# Patient Record
Sex: Male | Born: 1945 | Race: White | Hispanic: No | Marital: Single | Smoking: Former smoker
Health system: Southern US, Community
[De-identification: ages and names within clinical notes are randomized; demographics above are authoritative.]

## PROBLEM LIST (undated history)

## (undated) DIAGNOSIS — I10 Essential (primary) hypertension: Secondary | ICD-10-CM

## (undated) DIAGNOSIS — J3089 Other allergic rhinitis: Secondary | ICD-10-CM

## (undated) DIAGNOSIS — E785 Hyperlipidemia, unspecified: Secondary | ICD-10-CM

## (undated) DIAGNOSIS — M1990 Unspecified osteoarthritis, unspecified site: Secondary | ICD-10-CM

## (undated) HISTORY — DX: Essential (primary) hypertension: I10

## (undated) HISTORY — DX: Hyperlipidemia, unspecified: E78.5

## (undated) HISTORY — PX: TONSILLECTOMY AND ADENOIDECTOMY: SUR1326

## (undated) HISTORY — DX: Other allergic rhinitis: J30.89

## (undated) HISTORY — PX: COLONOSCOPY: SHX174

## (undated) HISTORY — DX: Unspecified osteoarthritis, unspecified site: M19.90

---

## 2001-04-21 ENCOUNTER — Encounter: Payer: Self-pay | Admitting: Internal Medicine

## 2001-04-21 ENCOUNTER — Ambulatory Visit (HOSPITAL_COMMUNITY): Admission: RE | Admit: 2001-04-21 | Discharge: 2001-04-21 | Payer: Self-pay | Admitting: Internal Medicine

## 2002-12-21 ENCOUNTER — Encounter: Payer: Self-pay | Admitting: Internal Medicine

## 2004-04-24 ENCOUNTER — Ambulatory Visit: Payer: Self-pay | Admitting: Internal Medicine

## 2004-05-01 ENCOUNTER — Ambulatory Visit: Payer: Self-pay | Admitting: Internal Medicine

## 2004-11-22 ENCOUNTER — Ambulatory Visit: Payer: Self-pay | Admitting: Internal Medicine

## 2005-01-22 ENCOUNTER — Ambulatory Visit: Payer: Self-pay | Admitting: Internal Medicine

## 2005-07-23 ENCOUNTER — Ambulatory Visit: Payer: Self-pay | Admitting: Internal Medicine

## 2005-11-26 ENCOUNTER — Ambulatory Visit: Payer: Self-pay | Admitting: Internal Medicine

## 2005-12-24 ENCOUNTER — Ambulatory Visit: Payer: Self-pay | Admitting: Internal Medicine

## 2006-04-08 ENCOUNTER — Ambulatory Visit: Payer: Self-pay | Admitting: Internal Medicine

## 2006-04-08 LAB — CONVERTED CEMR LAB: PSA: 1.92 ng/mL (ref 0.10–4.00)

## 2006-09-04 ENCOUNTER — Ambulatory Visit: Payer: Self-pay | Admitting: Internal Medicine

## 2006-10-30 ENCOUNTER — Ambulatory Visit: Payer: Self-pay | Admitting: Internal Medicine

## 2006-10-30 DIAGNOSIS — E785 Hyperlipidemia, unspecified: Secondary | ICD-10-CM | POA: Insufficient documentation

## 2006-10-31 ENCOUNTER — Encounter (INDEPENDENT_AMBULATORY_CARE_PROVIDER_SITE_OTHER): Payer: Self-pay | Admitting: *Deleted

## 2007-01-06 ENCOUNTER — Ambulatory Visit: Payer: Self-pay | Admitting: Internal Medicine

## 2007-09-09 ENCOUNTER — Ambulatory Visit: Payer: Self-pay | Admitting: Internal Medicine

## 2007-11-14 ENCOUNTER — Ambulatory Visit: Payer: Self-pay | Admitting: Internal Medicine

## 2007-11-14 DIAGNOSIS — K5730 Diverticulosis of large intestine without perforation or abscess without bleeding: Secondary | ICD-10-CM | POA: Insufficient documentation

## 2007-11-14 DIAGNOSIS — J309 Allergic rhinitis, unspecified: Secondary | ICD-10-CM | POA: Insufficient documentation

## 2007-11-17 ENCOUNTER — Telehealth (INDEPENDENT_AMBULATORY_CARE_PROVIDER_SITE_OTHER): Payer: Self-pay | Admitting: *Deleted

## 2008-03-15 ENCOUNTER — Ambulatory Visit: Payer: Self-pay | Admitting: Internal Medicine

## 2008-03-23 ENCOUNTER — Encounter (INDEPENDENT_AMBULATORY_CARE_PROVIDER_SITE_OTHER): Payer: Self-pay | Admitting: *Deleted

## 2008-03-23 LAB — CONVERTED CEMR LAB
ALT: 31 units/L (ref 0–53)
AST: 26 units/L (ref 0–37)
Albumin: 3.7 g/dL (ref 3.5–5.2)
Alkaline Phosphatase: 58 units/L (ref 39–117)
Bilirubin, Direct: 0.1 mg/dL (ref 0.0–0.3)
Cholesterol: 156 mg/dL (ref 0–200)
HDL: 44.1 mg/dL (ref >39.0)
LDL Cholesterol: 99 mg/dL (ref 0–99)
Total Bilirubin: 1.1 mg/dL (ref 0.3–1.2)
Total CHOL/HDL Ratio: 3.5
Total Protein: 6.5 g/dL (ref 6.0–8.3)
Triglycerides: 67 mg/dL (ref 0–149)
VLDL: 13 mg/dL (ref 0–40)

## 2008-10-19 ENCOUNTER — Telehealth (INDEPENDENT_AMBULATORY_CARE_PROVIDER_SITE_OTHER): Payer: Self-pay | Admitting: *Deleted

## 2008-11-08 ENCOUNTER — Ambulatory Visit: Payer: Self-pay | Admitting: Internal Medicine

## 2008-11-08 LAB — CONVERTED CEMR LAB
ALT: 33 units/L (ref 0–53)
AST: 27 units/L (ref 0–37)
Albumin: 3.8 g/dL (ref 3.5–5.2)
Alkaline Phosphatase: 57 units/L (ref 39–117)
BUN: 17 mg/dL (ref 6–23)
Basophils Absolute: 0 10*3/uL (ref 0.0–0.1)
Basophils Relative: 0.7 % (ref 0.0–3.0)
Bilirubin Urine: NEGATIVE
Bilirubin, Direct: 0 mg/dL (ref 0.0–0.3)
Blood in Urine, dipstick: NEGATIVE
CO2: 32 meq/L (ref 19–32)
Calcium: 9.3 mg/dL (ref 8.4–10.5)
Chloride: 104 meq/L (ref 96–112)
Cholesterol: 154 mg/dL (ref 0–200)
Creatinine, Ser: 0.9 mg/dL (ref 0.4–1.5)
Eosinophils Absolute: 0.1 10*3/uL (ref 0.0–0.7)
Eosinophils Relative: 1.5 % (ref 0.0–5.0)
GFR calc non Af Amer: 90.3 mL/min (ref >60)
Glucose, Bld: 105 mg/dL — ABNORMAL HIGH (ref 70–99)
Glucose, Urine, Semiquant: NEGATIVE
HCT: 51.1 % (ref 39.0–52.0)
HDL: 42.3 mg/dL (ref >39.00)
Hemoglobin: 17 g/dL (ref 13.0–17.0)
Hgb A1c MFr Bld: 5.9 % (ref 4.6–6.5)
Ketones, urine, test strip: NEGATIVE
LDL Cholesterol: 97 mg/dL (ref 0–99)
Lymphocytes Relative: 23.1 % (ref 12.0–46.0)
Lymphs Abs: 1.2 10*3/uL (ref 0.7–4.0)
MCHC: 33.2 g/dL (ref 30.0–36.0)
MCV: 97.8 fL (ref 78.0–100.0)
Monocytes Absolute: 0.4 10*3/uL (ref 0.1–1.0)
Monocytes Relative: 7.8 % (ref 3.0–12.0)
Neutro Abs: 3.6 10*3/uL (ref 1.4–7.7)
Neutrophils Relative %: 66.9 % (ref 43.0–77.0)
Nitrite: NEGATIVE
PSA: 1.76 ng/mL (ref 0.10–4.00)
Platelets: 175 10*3/uL (ref 150.0–400.0)
Potassium: 4.6 meq/L (ref 3.5–5.1)
Protein, U semiquant: NEGATIVE
RBC: 5.23 M/uL (ref 4.22–5.81)
RDW: 12.2 % (ref 11.5–14.6)
Sodium: 141 meq/L (ref 135–145)
Specific Gravity, Urine: 1.01
TSH: 2.32 microintl units/mL (ref 0.35–5.50)
Total Bilirubin: 1.1 mg/dL (ref 0.3–1.2)
Total CHOL/HDL Ratio: 4
Total Protein: 7.1 g/dL (ref 6.0–8.3)
Triglycerides: 73 mg/dL (ref 0.0–149.0)
Urobilinogen, UA: 0.2
VLDL: 14.6 mg/dL (ref 0.0–40.0)
WBC Urine, dipstick: NEGATIVE
WBC: 5.3 10*3/uL (ref 4.5–10.5)
pH: 6

## 2008-11-17 ENCOUNTER — Ambulatory Visit: Payer: Self-pay | Admitting: Internal Medicine

## 2008-11-17 DIAGNOSIS — G4730 Sleep apnea, unspecified: Secondary | ICD-10-CM | POA: Insufficient documentation

## 2008-11-18 ENCOUNTER — Encounter: Payer: Self-pay | Admitting: Internal Medicine

## 2008-11-26 ENCOUNTER — Telehealth (INDEPENDENT_AMBULATORY_CARE_PROVIDER_SITE_OTHER): Payer: Self-pay | Admitting: *Deleted

## 2008-12-16 ENCOUNTER — Ambulatory Visit: Payer: Self-pay | Admitting: Internal Medicine

## 2008-12-17 ENCOUNTER — Encounter: Payer: Self-pay | Admitting: Internal Medicine

## 2008-12-17 ENCOUNTER — Ambulatory Visit (HOSPITAL_BASED_OUTPATIENT_CLINIC_OR_DEPARTMENT_OTHER): Admission: RE | Admit: 2008-12-17 | Discharge: 2008-12-17 | Payer: Self-pay | Admitting: Internal Medicine

## 2008-12-18 ENCOUNTER — Ambulatory Visit: Payer: Self-pay | Admitting: Internal Medicine

## 2008-12-28 ENCOUNTER — Telehealth: Payer: Self-pay | Admitting: Internal Medicine

## 2009-03-10 ENCOUNTER — Ambulatory Visit: Payer: Self-pay | Admitting: Internal Medicine

## 2009-03-10 LAB — CONVERTED CEMR LAB
OCCULT 1: NEGATIVE
OCCULT 2: NEGATIVE
OCCULT 3: NEGATIVE

## 2009-03-11 ENCOUNTER — Encounter (INDEPENDENT_AMBULATORY_CARE_PROVIDER_SITE_OTHER): Payer: Self-pay | Admitting: *Deleted

## 2009-04-06 ENCOUNTER — Telehealth (INDEPENDENT_AMBULATORY_CARE_PROVIDER_SITE_OTHER): Payer: Self-pay | Admitting: *Deleted

## 2009-04-29 ENCOUNTER — Telehealth: Payer: Self-pay | Admitting: Internal Medicine

## 2009-11-09 ENCOUNTER — Ambulatory Visit: Payer: Self-pay | Admitting: Internal Medicine

## 2009-11-09 LAB — CONVERTED CEMR LAB
Albumin: 3.9 g/dL (ref 3.5–5.2)
Alkaline Phosphatase: 57 units/L (ref 39–117)
BUN: 19 mg/dL (ref 6–23)
Basophils Relative: 0.8 % (ref 0.0–3.0)
Bilirubin, Direct: 0.1 mg/dL (ref 0.0–0.3)
CO2: 29 meq/L (ref 19–32)
Chloride: 105 meq/L (ref 96–112)
Cholesterol: 141 mg/dL (ref 0–200)
Creatinine, Ser: 1 mg/dL (ref 0.4–1.5)
Glucose, Bld: 95 mg/dL (ref 70–99)
HCT: 45.9 % (ref 39.0–52.0)
Hemoglobin: 15.6 g/dL (ref 13.0–17.0)
LDL Cholesterol: 88 mg/dL (ref 0–99)
Lymphocytes Relative: 20.3 % (ref 12.0–46.0)
Lymphs Abs: 1 10*3/uL (ref 0.7–4.0)
Monocytes Relative: 9 % (ref 3.0–12.0)
Neutro Abs: 3.2 10*3/uL (ref 1.4–7.7)
Potassium: 4.8 meq/L (ref 3.5–5.1)
RBC: 4.78 M/uL (ref 4.22–5.81)
RDW: 13 % (ref 11.5–14.6)
Total CHOL/HDL Ratio: 4
Total Protein: 6.2 g/dL (ref 6.0–8.3)
Triglycerides: 66 mg/dL (ref 0.0–149.0)
VLDL: 13.2 mg/dL (ref 0.0–40.0)

## 2009-11-14 ENCOUNTER — Ambulatory Visit: Payer: Self-pay | Admitting: Internal Medicine

## 2009-11-14 DIAGNOSIS — I10 Essential (primary) hypertension: Secondary | ICD-10-CM | POA: Insufficient documentation

## 2010-03-19 LAB — CONVERTED CEMR LAB
ALT: 29 units/L (ref 0–53)
ALT: 32 units/L (ref 0–53)
AST: 25 units/L (ref 0–37)
AST: 30 units/L (ref 0–37)
Albumin: 3.7 g/dL (ref 3.5–5.2)
Albumin: 4.1 g/dL (ref 3.5–5.2)
Alkaline Phosphatase: 69 units/L (ref 39–117)
Alkaline Phosphatase: 78 units/L (ref 39–117)
BUN: 15 mg/dL (ref 6–23)
BUN: 17 mg/dL (ref 6–23)
Basophils Absolute: 0 10*3/uL (ref 0.0–0.1)
Basophils Absolute: 0 10*3/uL (ref 0.0–0.1)
Basophils Relative: 0.2 % (ref 0.0–1.0)
Basophils Relative: 0.8 % (ref 0.0–3.0)
Bilirubin, Direct: 0.1 mg/dL (ref 0.0–0.3)
Bilirubin, Direct: 0.2 mg/dL (ref 0.0–0.3)
CO2: 31 meq/L (ref 19–32)
CO2: 31 meq/L (ref 19–32)
Calcium: 9.2 mg/dL (ref 8.4–10.5)
Calcium: 9.4 mg/dL (ref 8.4–10.5)
Chloride: 104 meq/L (ref 96–112)
Chloride: 105 meq/L (ref 96–112)
Cholesterol, target level: 200 mg/dL
Cholesterol: 169 mg/dL (ref 0–200)
Cholesterol: 173 mg/dL (ref 0–200)
Creatinine, Ser: 0.9 mg/dL (ref 0.4–1.5)
Creatinine, Ser: 0.9 mg/dL (ref 0.4–1.5)
Eosinophils Absolute: 0.1 10*3/uL (ref 0.0–0.6)
Eosinophils Absolute: 0.1 10*3/uL (ref 0.0–0.7)
Eosinophils Relative: 1.5 % (ref 0.0–5.0)
Eosinophils Relative: 2 % (ref 0.0–5.0)
GFR calc Af Amer: 110 mL/min
GFR calc Af Amer: 110 mL/min
GFR calc non Af Amer: 91 mL/min
GFR calc non Af Amer: 91 mL/min
Glucose, Bld: 102 mg/dL — ABNORMAL HIGH (ref 70–99)
Glucose, Bld: 95 mg/dL (ref 70–99)
HCT: 46.6 % (ref 39.0–52.0)
HCT: 47.2 % (ref 39.0–52.0)
HDL goal, serum: 40 mg/dL
HDL: 41.6 mg/dL (ref >39.0)
HDL: 42.1 mg/dL (ref >39.0)
Hemoglobin: 15.9 g/dL (ref 13.0–17.0)
Hemoglobin: 16.3 g/dL (ref 13.0–17.0)
LDL Cholesterol: 109 mg/dL — ABNORMAL HIGH (ref 0–99)
LDL Cholesterol: 114 mg/dL — ABNORMAL HIGH (ref 0–99)
LDL Goal: 90 mg/dL
Lymphocytes Relative: 21 % (ref 12.0–46.0)
Lymphocytes Relative: 21.6 % (ref 12.0–46.0)
MCHC: 33.6 g/dL (ref 30.0–36.0)
MCHC: 35 g/dL (ref 30.0–36.0)
MCV: 94.8 fL (ref 78.0–100.0)
MCV: 96.6 fL (ref 78.0–100.0)
Monocytes Absolute: 0.4 10*3/uL (ref 0.2–0.7)
Monocytes Absolute: 0.5 10*3/uL (ref 0.1–1.0)
Monocytes Relative: 8.4 % (ref 3.0–12.0)
Monocytes Relative: 9.5 % (ref 3.0–11.0)
Neutro Abs: 3.2 10*3/uL (ref 1.4–7.7)
Neutro Abs: 3.7 10*3/uL (ref 1.4–7.7)
Neutrophils Relative %: 67.2 % (ref 43.0–77.0)
Neutrophils Relative %: 67.8 % (ref 43.0–77.0)
PSA: 1.56 ng/mL (ref 0.10–4.00)
PSA: 1.85 ng/mL (ref 0.10–4.00)
Platelets: 220 10*3/uL (ref 150–400)
Platelets: 230 10*3/uL (ref 150–400)
Potassium: 4.5 meq/L (ref 3.5–5.1)
Potassium: 4.6 meq/L (ref 3.5–5.1)
RBC: 4.88 M/uL (ref 4.22–5.81)
RBC: 4.92 M/uL (ref 4.22–5.81)
RDW: 11.4 % — ABNORMAL LOW (ref 11.5–14.6)
RDW: 12.1 % (ref 11.5–14.6)
Sodium: 140 meq/L (ref 135–145)
Sodium: 141 meq/L (ref 135–145)
TSH: 3.08 microintl units/mL (ref 0.35–5.50)
TSH: 3.24 microintl units/mL (ref 0.35–5.50)
Total Bilirubin: 0.9 mg/dL (ref 0.3–1.2)
Total Bilirubin: 1 mg/dL (ref 0.3–1.2)
Total CHOL/HDL Ratio: 4
Total CHOL/HDL Ratio: 4.2
Total Protein: 6.6 g/dL (ref 6.0–8.3)
Total Protein: 7 g/dL (ref 6.0–8.3)
Triglycerides: 88 mg/dL (ref 0–149)
Triglycerides: 92 mg/dL (ref 0–149)
VLDL: 18 mg/dL (ref 0–40)
VLDL: 18 mg/dL (ref 0–40)
WBC: 4.7 10*3/uL (ref 4.5–10.5)
WBC: 5.5 10*3/uL (ref 4.5–10.5)

## 2010-03-21 NOTE — Progress Notes (Signed)
Summary: Medication Concerns  Phone Note Outgoing Call Call back at [facility] Phone [phone] Call back at [phone]   Call placed by: Shonna Chock,  April 06, 2009 8:56 AM Call placed to: Patient Summary of Call: Left message on machine for patient to return call when avaliable, Reason for call:  We recieved a refill request for Amlodipine-Benazepril 5-20 (Other name: Lotrel) and we do not have that on med list, we have patient taking amlodipine 5mg  once daily . Need to clarify./Chrae [facility]  April 06, 2009 8:58 AM   Follow-up for Phone Call        Called patient on work number and he said Dr.Hopper gave him a RX for the combo med. Patient said the pharmacy filled it for him yesterday for 5-10 but he is not sure if it should be5-10 or 5-20. Patient said if I could check and let him know.  Dr.Hopper I am unfamiliar with this entire RX being prescibed for I have reviewed the chart and see no documentation. Please clarify and forward back to me to futher discuss with patient. Follow-up by: Shonna Chock,  April 06, 2009 11:55 AM  Additional Follow-up for Phone Call Additional follow up Details #1::        Take 5/10 & monitor BP ; goal = AVERAGE < 135/85 Additional Follow-up by: Marga Melnick MD,  April 06, 2009 1:44 PM    Additional Follow-up for Phone Call Additional follow up Details #2::    Patient aware, patient said he does not need a rx @ this time./Chrae [facility]  April 06, 2009 2:31 PM   New/Updated Medications: AMLODIPINE BESY-BENAZEPRIL HCL 5-10 MG CAPS (AMLODIPINE BESY-BENAZEPRIL HCL) 1 by mouth once daily

## 2010-03-21 NOTE — Progress Notes (Signed)
Summary: elevated BP   Phone Note Call from Patient Call back at [phone]   Caller: Patient Summary of Call: Pt states that BP has been running 136 to146 and the bottom number averaging 80-82. pt denies any symptoms other than BP elevated. pt currently taking AMLODIPINE BESY-BENAZEPRIL HCL 5-10 MG. pt [facility]. pls advise..................Marland KitchenFelecia Deloach CMA  April 29, 2009 10:27 AM   Follow-up for Phone Call        take 2 pills (total dose 10/20)once daily & monitor ; goal = average < 135/85  Additional Follow-up for Phone Call Additional follow up Details #1::        pt aware..............Marland KitchenFelecia Deloach CMA  April 29, 2009 11:57 AM     New/Updated Medications: AMLODIPINE BESY-BENAZEPRIL HCL 5-10 MG CAPS (AMLODIPINE BESY-BENAZEPRIL HCL) Take 2 tab by mouth once daily

## 2010-03-21 NOTE — Letter (Signed)
Summary: Results Follow up Letter  [facility] [place]  [address] [place], [place] [address]   Phone: [phone]  Fax: [phone]    03/11/2009 MRN: [id number]  TARIQ PERNELL [address] [place], [place]  [address]  Dear Mr. FERIA,  The following are the results of your recent test(s):  Test         Result    Pap Smear:        Normal _____  Not Normal _____ Comments: ______________________________________________________ Cholesterol: LDL(Bad cholesterol):         Your goal is less than:         HDL (Good cholesterol):       Your goal is more than: Comments:  ______________________________________________________ Mammogram:        Normal _____  Not Normal _____ Comments:  ___________________________________________________________________ Hemoccult:        Normal __X___  Not normal _______ Comments:    _____________________________________________________________________ Other Tests:    We routinely do not discuss normal results over the telephone.  If you desire a copy of the results, or you have any questions about this information we can discuss them at your next office visit.   Sincerely,

## 2010-03-21 NOTE — Assessment & Plan Note (Signed)
Summary: CPX,MEDICARE & BCBS/RH....   Vital Signs:  Patient profile:   65 year old male Height:      68.5 inches Weight:      188.38 pounds BMI:     28.33 Pulse rate:   77 / minute Pulse rhythm:   regular Resp:     14 per minute BP sitting:   132 / 84  (left arm) Cuff size:   large  Vitals Entered By: Army Fossa CMA (November 14, 2009 1:06 PM) CC: Medicare CPX, fasting. No complaints. flu shot, Lipid Management   Primary Care Alena Blankenbeckler:  Dr. Marga Melnick  CC:  Medicare CPX, fasting. No complaints. flu shot, and Lipid Management.  History of Present Illness: .Here for Medicare AWV: 1.Risk factors based on Past M, S, F history:Dyslipidemia; HTN( Chart updated) 2.Physical Activities:hunting; walking 1.5 mpd 5X/week  3.Depression/mood: no issues 4.Hearing: intact @ 6 ft to whisper 5.ADL's: no limitations 6.Fall Risk: no balance or co-ordination issues 7.Home Safety: no issues 8.Height, weight, &visual acuity:wall chart read w/o lenses @ 6 ft 9.Counseling: none requested  10.Labs ordered based on risk factors: results reviewed & risks discussed 11.  Referral Coordination: none requested 12. Care Plan: see Instructions 13. Cognitive Assessment: Oriented X 3; memory & recall  intact  ; "WORLD" spelled backwars; mood & affect normal. Hyperlipidemia Follow-Up      This is a 65 year old man who presents for Hyperlipidemia follow-up.  The patient reports muscle aches  only after golf, but denies GI upset, abdominal pain, flushing, itching, constipation, diarrhea, and fatigue.  The patient denies the following symptoms: chest pain/pressure, exercise intolerance, dypsnea, palpitations, syncope, and pedal edema.  Compliance with medications (by patient report) has been near 100%.  Dietary compliance has been good.  Adjunctive measures currently used by the patient include ASA and fish oil supplements.   Hypertension Follow-Up      The patient also presents for Hypertension  follow-up.  The patient reports urinary frequency, but denies lightheadedness and headaches.  Compliance with medications (by patient report) has been near 100%.  Adjunctive measures currently used by the patient include salt restriction.  BP 120/80 @ home. Cough essentially gone off ACE-I.  Lipid Management History:      Positive NCEP/ATP III risk factors include male age 15 years old or older and hypertension.  Negative NCEP/ATP III risk factors include non-diabetic, no family history for ischemic heart disease, non-tobacco-user status, no ASHD (atherosclerotic heart disease), no prior stroke/TIA, no peripheral vascular disease, and no history of aortic aneurysm.     Preventive Screening-Counseling & Management  Alcohol-Tobacco     Alcohol drinks/day: 2     Smoking Status: quit     Packs/Day: up to 1/2 ppd     Year Quit: 1970  Caffeine-Diet-Exercise     Caffeine use/day: 1 cup every other day     Diet Comments: no specific diet  Hep-HIV-STD-Contraception     Dental Visit-last 6 months yes     Sun Exposure-Excessive: no  Safety-Violence-Falls     Seat Belt Use: yes     Firearms in the Home: firearms in the home     Firearm Counseling: not indicated; uses recommended firearm safety measures     Smoke Detectors: no      Sexual History:  Single and dating.        Blood Transfusions:  no.        Travel History:  no recent foreign travel.    Current Medications (verified): 1)  Bayer Low Strength 81 Mg Tbec (Aspirin) .Marland Kitchen.. 1 By Mouth Once Daily 2)  Crestor 20 Mg Tabs (Rosuvastatin Calcium) .Marland Kitchen.. 1 Qd 3)  Viagra 100 Mg Tabs (Sildenafil Citrate) .... 1/2 -1 Once Daily As Needed 4)  Amlodipine Besy-Benazepril Hcl 5-10 Mg Caps (Amlodipine Besy-Benazepril Hcl) .... Take 2 Tab By Mouth Once Daily 5)  Prostate Pro .... Daily  Allergies (verified): 1)  ! Benazepril Hcl (Benazepril Hcl)  Past History:  Past Medical History: Hyperlipidemia:LDL 138 ( 2067 total particles / 1188 small  dense ), TG 59 in 2004; LDL goal = < 100,ideally < 70. Framingham Study LDL goal = < 130. Allergic rhinitis Diverticulosis, colon 2003 Hypertension  Past Surgical History: Colonoscopy:Tics  2003, Dr  Barnet Pall Tonsillectomy  Family History: Father: died  disseminated  intraabdominal cancer , Asbestos exposure Mother:  goiter, coma ? 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 Alcohol use-yes:socially Regular exercise-yes: golf, hunt,walks 5X/week , 1.5 mpd, hunts Former Smoker: quit [employer] x 4 years Divorced/ single  Packs/Day:  up to 1/2 ppd Caffeine use/day:  1 cup every other day Dental Care w/in 6 mos.:  yes Sun Exposure-Excessive:  no Seat Belt Use:  yes Sexual History:  Single, dating Blood Transfusions:  no  Review of Systems  The patient denies anorexia, weight loss, weight gain, vision loss, decreased hearing, hoarseness, prolonged cough, hemoptysis, melena, hematochezia, severe indigestion/heartburn, hematuria, suspicious skin lesions, depression, unusual weight change, abnormal bleeding, enlarged lymph nodes, and angioedema.    Physical Exam  General:  well-nourished; alert,appropriate and cooperative throughout examination Head:  Normocephalic and atraumatic without obvious abnormalities.  Moustache & goatee Eyes:  No corneal or conjunctival inflammation noted. EOMI. Perrla. Funduscopic exam benign, without hemorrhages, exudates or papilledema.  Ears:  External ear exam shows no significant lesions or deformities.  Otoscopic examination reveals clear canals, tympanic membranes are intact bilaterally without bulging, retraction, inflammation or discharge. Hearing is grossly normal bilaterally. Nose:  External nasal examination shows no deformity or inflammation. Nasal mucosa are pink and moist without lesions or exudates. Mouth:  Oral mucosa and oropharynx without lesions or exudates.  Teeth in good  repair. Neck:  No deformities, masses, or tenderness noted. Lungs:  Normal respiratory effort, chest expands symmetrically. Lungs are clear to auscultation, no crackles or wheezes. Heart:  Normal rate and regular rhythm. S1 and S2 normal without gallop, murmur, click, rub.S4 Abdomen:  Bowel sounds positive,abdomen soft and non-tender without masses, organomegaly or hernias noted. Rectal:  No external abnormalities noted. Normal sphincter tone. No rectal masses or tenderness. Genitalia:  Testes bilaterally descended without nodularity, tenderness or masses. No scrotal masses or lesions. No penis lesions or urethral discharge. L varicocele.   Prostate:  no gland enlargement, no nodules, but  R lobe slightly lrger than L Msk:  No deformity or scoliosis noted of thoracic or lumbar spine.   Pulses:  R and L carotid,radial,dorsalis pedis and posterior tibial pulses are full and equal bilaterally Extremities:  No clubbing, cyanosis, edema, or deformity noted with normal full range of motion of all joints.   Neurologic:  alert & oriented X3 and DTRs symmetrical and normal.   Skin:  Intact without suspicious lesions or rashes. Cherry angioma R inferior thorax Cervical Nodes:  No lymphadenopathy noted Axillary Nodes:  No palpable lymphadenopathy Inguinal Nodes:  No significant adenopathy Psych:  memory intact for recent and remote, normally interactive, and good eye contact.  Impression & Recommendations:  Problem # 1:  PREVENTIVE HEALTH CARE (ICD-V70.0)  Orders: Medicare -1st Annual Wellness Visit [id number])  Problem # 2:  HYPERTENSION (ICD-401.9)  The following medications were removed from the medication list:    Amlodipine Besy-benazepril Hcl 5-10 Mg Caps (Amlodipine besy-benazepril hcl) .Marland Kitchen... Take 2 tab by mouth once daily His updated medication list for this problem includes:    Losartan Potassium 100 Mg Tabs (Losartan potassium) .Marland Kitchen... 1 once daily    Amlodipine Besylate 5 Mg Tabs  (Amlodipine besylate) .Marland Kitchen... 1 once daily  Orders: EKG w/ Interpretation (93000)  Problem # 3:  HYPERLIPIDEMIA (ICD-272.4)  His updated medication list for this problem includes:    Crestor 20 Mg Tabs (Rosuvastatin calcium) .Marland Kitchen... 1 qd  Problem # 4:  SLEEP APNEA (ICD-780.57) Snoring 95% improved with Breathe Right strips by history, no CPAP employed  Complete Medication List: 1)  Bayer Low Strength 81 Mg Tbec (Aspirin) .Marland Kitchen.. 1 by mouth once daily 2)  Crestor 20 Mg Tabs (Rosuvastatin calcium) .Marland Kitchen.. 1 qd 3)  Viagra 100 Mg Tabs (Sildenafil citrate) .... 1/2 -1 once daily as needed 4)  Prostate Pro  .... Daily 5)  Losartan Potassium 100 Mg Tabs (Losartan potassium) .Marland Kitchen.. 1 once daily 6)  Amlodipine Besylate 5 Mg Tabs (Amlodipine besylate) .Marland Kitchen.. 1 once daily  Other Orders: Flu Vaccine 6yrs + MEDICARE PATIENTS (N0272) Administration Flu vaccine - MCR (G0008)  Lipid Assessment/Plan:      Based on NCEP/ATP III, the patient's risk factor category is "0-1 risk factors".  The patient's lipid goals are as follows: Total cholesterol goal is 200; LDL cholesterol goal is 100; HDL cholesterol goal is 40; Triglyceride goal is 150.  His LDL cholesterol goal has been met.  Secondary causes for hyperlipidemia have been ruled out.  He has been counseled on adjunctive measures for lowering his cholesterol and has been provided with dietary instructions.   Flu Vaccine Consent Questions     Do you have a history of severe allergic reactions to this vaccine? no    Any prior history of allergic reactions to egg and/or gelatin? no    Do you have a sensitivity to the preservative Thimersol? no    Do you have a past history of Guillan-Barre Syndrome? no    Do you currently have an acute febrile illness? no    Have you ever had a severe reaction to latex? no    Vaccine information given and explained to patient? yes    Are you currently pregnant? no    Lot Number:[id number]   Exp Date:08/19/2010   Site Given  RIGHT  Deltoid IM  Patient Instructions: 1)  Take an  81 mg coated Aspirin every day. 2)  Check your Blood Pressure regularly. If it is above: 135/85 ON AVERAGE  you should make an appointment. Reassess need for CPAP if snoring progresses or apnea noted. Prescriptions: AMLODIPINE BESYLATE 5 MG TABS (AMLODIPINE BESYLATE) 1 once daily  #90 x 3   Entered and Authorized by:   Marga Melnick MD   Signed by:   Marga Melnick MD on 11/14/2009   Method used:   Print then Give to Patient   RxID:   [id number] LOSARTAN POTASSIUM 100 MG TABS (LOSARTAN POTASSIUM) 1 once daily  #90 x 3   Entered and Authorized by:   Marga Melnick MD   Signed by:   Marga Melnick MD on 11/14/2009   Method used:   Print then Give to Patient   RxID:  [id number] CRESTOR 20 MG TABS (ROSUVASTATIN CALCIUM) 1 qd  #90 Tablet x 3   Entered and Authorized by:   Marga Melnick MD   Signed by:   Marga Melnick MD on 11/14/2009   Method used:   Print then Give to Patient   RxID:   [id number]    .lbmedflu

## 2010-04-10 ENCOUNTER — Encounter: Payer: Self-pay | Admitting: Internal Medicine

## 2010-04-10 ENCOUNTER — Telehealth: Payer: Self-pay | Admitting: Internal Medicine

## 2010-04-10 ENCOUNTER — Ambulatory Visit (INDEPENDENT_AMBULATORY_CARE_PROVIDER_SITE_OTHER): Payer: Medicare Other | Admitting: Internal Medicine

## 2010-04-10 DIAGNOSIS — H6500 Acute serous otitis media, unspecified ear: Secondary | ICD-10-CM | POA: Insufficient documentation

## 2010-04-18 NOTE — Assessment & Plan Note (Signed)
Summary: ear pain--PH   Vital Signs:  Patient profile:   65 year old male Weight:      192.4 pounds BMI:     28.93 Temp:     97.5 degrees F oral Pulse rate:   60 / minute Resp:     14 per minute BP sitting:   112 / 78  (left arm) Cuff size:   large  Vitals Entered By: Shonna Chock CMA (April 10, 2010 9:19 AM) CC: Noise in ears when laying down x 1 week , Ear pain   Primary Care Provider:  Dr. Marga Melnick  CC:  Noise in ears when laying down x 1 week  and Ear pain.  History of Present Illness:    Onset 1 week ago as "ears filling up" ;he also reports ear discharge, sensation of fullness, and hearing loss, but denies tinnitus, fever, sinus pain, and nasal discharge.   Symptoms are  located in both ears & described as constant.  The patient denies headache, toothache, dizziness, and vertigo.  Patient's history includes  some hay fever/allergies and impacted cerumen. His floors are being redone & house painted .  Rx; peroxide drops.  Current Medications (verified): 1)  Bayer Low Strength 81 Mg Tbec (Aspirin) .Marland Kitchen.. 1 By Mouth Once Daily 2)  Crestor 20 Mg Tabs (Rosuvastatin Calcium) .Marland Kitchen.. 1 Qd 3)  Viagra 100 Mg Tabs (Sildenafil Citrate) .... 1/2 -1 Once Daily As Needed 4)  Prostate Pro .... Daily 5)  Losartan Potassium 100 Mg Tabs (Losartan Potassium) .Marland Kitchen.. 1 Once Daily 6)  Amlodipine Besylate 5 Mg Tabs (Amlodipine Besylate) .Marland Kitchen.. 1 Once Daily  Allergies: 1)  ! Benazepril Hcl (Benazepril Hcl)  Physical Exam  General:  well-nourished,in no acute distress; alert,appropriate and cooperative throughout examination Ears:  External ear exam shows no significant lesions or deformities.  Otoscopic examination reveals clear canals, tympanic membranes are intact bilaterally without bulging, retraction, inflammation or discharge but they are dull. Hearing is grossly normal bilaterally.Rinne  (BC>AC) and Weber normal.   Nose:  External nasal examination shows no deformity or inflammation.  Nasal mucosa are pink and moist without lesions or exudates. Mouth:  Oral mucosa and oropharynx without lesions or exudates.  Teeth in good repair. Mild pharyngeal erythema (uvula).   Cervical Nodes:  No lymphadenopathy noted Axillary Nodes:  No palpable lymphadenopathy   Impression & Recommendations:  Problem # 1:  OTITIS MEDIA, SEROUS, ACUTE, BILATERAL (ICD-381.01)  Complete Medication List: 1)  Bayer Low Strength 81 Mg Tbec (Aspirin) .Marland Kitchen.. 1 by mouth once daily 2)  Crestor 20 Mg Tabs (Rosuvastatin calcium) .Marland Kitchen.. 1 qd 3)  Viagra 100 Mg Tabs (Sildenafil citrate) .... 1/2 -1 once daily as needed 4)  Prostate Pro  .... Daily 5)  Losartan Potassium 100 Mg Tabs (Losartan potassium) .Marland Kitchen.. 1 once daily 6)  Amlodipine Besylate 5 Mg Tabs (Amlodipine besylate) .Marland Kitchen.. 1 once daily 7)  Fluticasone Propionate 50 Mcg/act Susp (Fluticasone propionate) .Marland Kitchen.. 1 spray two times a day as needed 8)  Prednisone 20 Mg Tabs (Prednisone) .Marland Kitchen.. 1 two times a day with a meal 9)  Coly-mycin S 3.04-21-08-0.5 Mg/ml Susp (Neomycin-colist-hc-thonzonium) .... 3 drops 4x/ day  Patient Instructions: 1)  Drink as much NON dairy  fluid as you can tolerate for the next few days. Prescriptions: COLY-MYCIN S 3.04-21-08-0.5 MG/ML SUSP (NEOMYCIN-COLIST-HC-THONZONIUM) 3 drops 4X/ day  #10cc x 0   Entered and Authorized by:   Marga Melnick MD   Signed by:   Marga Melnick MD on 04/10/2010  Method used:   Print then Give to Patient   RxID:   [id number] PREDNISONE 20 MG TABS (PREDNISONE) 1 two times a day with a meal  #10 x 0   Entered and Authorized by:   Marga Melnick MD   Signed by:   Marga Melnick MD on 04/10/2010   Method used:   Electronically to        [id number] (retail)       [address]       [place], [place]  [address]       Ph: [phone] or [phone]       Fax: [phone]   RxID:   [id number] FLUTICASONE PROPIONATE 50 MCG/ACT SUSP (FLUTICASONE PROPIONATE)  1 spray two times a day as needed  #1 x 5   Entered and Authorized by:   Marga Melnick MD   Signed by:   Marga Melnick MD on 04/10/2010   Method used:   Electronically to        [id number] (retail)       [address]       [place], [place]  [address]       Ph: [phone] or [phone]       Fax: [phone]   RxID:   [id number]    Orders Added: 1)  Est. Patient Level III [[address]]

## 2010-04-18 NOTE — Progress Notes (Signed)
Summary: RX change/substitution  Phone Note Refill Request Message from:  Pharmacy on April 10, 2010 9:57 AM  Refills Requested: Medication #1:  COLY-MYCIN S 3.04-21-08-0.5 MG/ML SUSP 3 drops 4X/ day.   Notes: [facility] called to ask if prescription above could be change to Generic Cortisporin. Patient will save the patient $34. **I gave permission to make this change for the patient.  Initial call taken by: Lucious Groves CMA,  April 10, 2010 9:57 AM  Follow-up for Phone Call        thanks; that is correct Follow-up by: Marga Melnick MD,  April 10, 2010 1:12 PM

## 2010-06-23 ENCOUNTER — Other Ambulatory Visit: Payer: Self-pay | Admitting: *Deleted

## 2010-06-23 MED ORDER — CELECOXIB 200 MG PO CAPS: ORAL_CAPSULE | ORAL | Status: DC

## 2010-06-23 NOTE — Telephone Encounter (Signed)
Pt called says he was given samples of celebrex and would like to have rx sent to pharmacy. [facility]

## 2010-07-12 ENCOUNTER — Telehealth: Payer: Self-pay | Admitting: *Deleted

## 2010-07-12 NOTE — Telephone Encounter (Signed)
Pt called and left message on triage line, wants Generic med- unsure of name. Called pt had to leave a message.

## 2010-07-13 MED ORDER — CYCLOBENZAPRINE HCL 5 MG PO TABS: ORAL_TABLET | ORAL | Status: DC

## 2010-07-13 NOTE — Telephone Encounter (Signed)
I spoke w/ pt he states he has been prescribed Metaxalone 800 mg, not by Dr.Hopper. The prescribing doc has retired. He states he cannot afford this medication. Would like an alternative muscle relaxant. Please advise.

## 2010-07-13 NOTE — Telephone Encounter (Signed)
Generic Flexeril 5 mg ( cyclobenzaprine) 1-2 pills at bedtime as needed dispense# 20

## 2010-07-13 NOTE — Telephone Encounter (Signed)
Pt is aware.  

## 2010-08-09 ENCOUNTER — Ambulatory Visit (INDEPENDENT_AMBULATORY_CARE_PROVIDER_SITE_OTHER): Payer: Medicare Other | Admitting: Family Medicine

## 2010-08-09 VITALS — BP 130/88 | HR 62 | Temp 98.0°F | Wt 187.8 lb

## 2010-08-09 DIAGNOSIS — S59909A Unspecified injury of unspecified elbow, initial encounter: Secondary | ICD-10-CM

## 2010-08-09 DIAGNOSIS — S59919A Unspecified injury of unspecified forearm, initial encounter: Secondary | ICD-10-CM

## 2010-08-09 DIAGNOSIS — R42 Dizziness and giddiness: Secondary | ICD-10-CM

## 2010-08-09 NOTE — Progress Notes (Signed)
  Subjective:    Patient ID: Daniel Heath, male    DOB: 1946-01-25, 65 y.o.   MRN: [id number]  HPI Lightheaded- had some dizziness yesterday afternoon and again this AM.  Each episode lasted ~5 minutes.  Denies vertigo, 'felt off balance'.  Got out of [employer] this AM and walked across parking lot and that's when he had dizziness.  Yesterday episode occurred w/ prolonged standing.  Currently on Amlodipine and Losartan.  Drinking fluids.    L arm numbness- occuring in forearm.  Is using 'hand springs' to strengthen muscles and it feels as if this is irritated.  Doesn't feel this is related to dizziness, but 'i just thought you should know'   Review of Systems For ROS see HPI     Objective:   Physical Exam  Constitutional: He is oriented to person, place, and time. He appears well-developed and well-nourished. No distress.  HENT:  Head: Normocephalic and atraumatic.  Eyes: Conjunctivae and EOM are normal. Pupils are equal, round, and reactive to light.  Neck: Normal range of motion. Neck supple. No thyromegaly present.  Cardiovascular: Normal rate, regular rhythm, normal heart sounds and intact distal pulses.   Pulmonary/Chest: Effort normal and breath sounds normal. No respiratory distress. He has no wheezes. He has no rales.  Lymphadenopathy:    He has no cervical adenopathy.  Neurological: He is alert and oriented to person, place, and time. He has normal reflexes. No cranial nerve deficit. Coordination normal.  Skin: Skin is warm and dry.  Psychiatric: He has a normal mood and affect. His behavior is normal.          Assessment & Plan:

## 2010-08-09 NOTE — Patient Instructions (Signed)
Your EKG looks fine- this is good news! Your symptoms are most likely due to rapid position change and mild dehydration Make sure you increase your fluid intake- especially in the heat! Change positions slowly- allow your body time to adjust If your symptoms change or worsen- please call or go to the ER Hang in there!!!

## 2010-08-13 DIAGNOSIS — S59919A Unspecified injury of unspecified forearm, initial encounter: Secondary | ICD-10-CM | POA: Insufficient documentation

## 2010-08-13 NOTE — Assessment & Plan Note (Signed)
Pt's numbness is likely due to muscle overuse from handsprings.  Unrelated to dizziness.

## 2010-08-13 NOTE — Assessment & Plan Note (Signed)
Pt's EKG w/out acute process or arrhythmia that would explain dizziness.  Based on pt's sxs and timing of events it sounds as if he is having a vaso-vagal episode.  BP is dropping rapidly w/ position change or prolonged standing.  This coupled w/ relative dehydration and increased heat are causing pre-syncopal events.  Stressed importance of changing positions slowly, increasing fluids.  Reviewed supportive care and red flags that should prompt return.  Pt expressed understanding and is in agreement w/ plan.

## 2010-09-19 ENCOUNTER — Telehealth: Payer: Self-pay | Admitting: Internal Medicine

## 2010-09-19 NOTE — Telephone Encounter (Signed)
appt is for [facility] 09/25/2010

## 2010-09-19 NOTE — Telephone Encounter (Signed)
Message copied by Merrie Roof on Tue Sep 19, 2010  4:05 PM ------      Message from: Edgardo Roys      Created: Mon Sep 18, 2010  3:32 PM       Per Dr.Hopper please have patient schedule fasting [facility] appointment [phone].             Phone number to reach patient: [phone]

## 2010-09-25 ENCOUNTER — Other Ambulatory Visit (INDEPENDENT_AMBULATORY_CARE_PROVIDER_SITE_OTHER): Payer: Medicare Other

## 2010-09-25 DIAGNOSIS — E785 Hyperlipidemia, unspecified: Secondary | ICD-10-CM

## 2010-09-25 NOTE — Progress Notes (Signed)
[facility]  

## 2010-10-12 ENCOUNTER — Encounter: Payer: Self-pay | Admitting: Internal Medicine

## 2010-10-16 ENCOUNTER — Encounter: Payer: Self-pay | Admitting: Internal Medicine

## 2010-10-16 ENCOUNTER — Ambulatory Visit (INDEPENDENT_AMBULATORY_CARE_PROVIDER_SITE_OTHER): Payer: Medicare Other | Admitting: Internal Medicine

## 2010-10-16 VITALS — BP 138/86 | HR 87 | Wt 184.0 lb

## 2010-10-16 DIAGNOSIS — I10 Essential (primary) hypertension: Secondary | ICD-10-CM

## 2010-10-16 DIAGNOSIS — Z1211 Encounter for screening for malignant neoplasm of colon: Secondary | ICD-10-CM

## 2010-10-16 DIAGNOSIS — E785 Hyperlipidemia, unspecified: Secondary | ICD-10-CM

## 2010-10-16 MED ORDER — PRAVASTATIN SODIUM 40 MG PO TABS: 40.0000 mg | ORAL_TABLET | Freq: Every evening | ORAL | Status: DC

## 2010-10-16 NOTE — Progress Notes (Signed)
  Subjective:    Patient ID: Daniel Heath, male    DOB: August 18, 1945, 65 y.o.   MRN: [id number]  HPI Dyslipidemia assessment: Prior Advanced Lipid Testing: NMR Lipoprofile 2004: LDL 138 ( 2067/1188), TG 59. LDL goal = < 100, ideally < 70..   Family history of premature CAD/ MI: no .  Nutrition: low fat .  Exercise: walking 2 mpd 5X / week & he hunts seasonally . Diabetes : A1c 5.8% . HTN: well controlled , 120-130/80. Smoking history  : quit 1970 .   Weight :  stable. ROS: fatigue: no ; chest pain : no ;claudication: no; palpitations: no; abd pain/bowel changes: no ; myalgias:"stiff in joints & back on Crestor";  syncope : no ; memory loss: no;skin changes: no. Lab results reviewed :[facility] Heart Panel: not hyperabsorber; Lp(a) 106(< 30); LDL > 100.     Review of Systems     Objective:   Physical Exam Gen.: Healthy and well-nourished in appearance. Alert, appropriate and cooperative throughout exam. Neck: No deformities, masses, or tenderness noted. Thyroid  normal. Lungs: Normal respiratory effort; chest expands symmetrically. Lungs are clear to auscultation without rales, wheezes, or increased work of breathing. Heart: Normal rate and rhythm. Normal S1 and S2. No gallop, click, or rub. S4 w/o  murmur. Abdomen: Bowel sounds normal; abdomen soft and nontender. No masses, organomegaly or hernias noted.No AAA or bruits                                                                              Musculoskeletal/extremities:No clubbing, cyanosis, edema, or deformity noted. Tone & strength  normal.Joints normal. Nail health  good. Vascular: Carotid, radial artery, dorsalis pedis and  posterior tibial pulses are full and equal. No bruits present. Neurologic: Alert and oriented x3. Deep tendon reflexes symmetrical and normal.          Skin: Intact without suspicious lesions or rashes. Psych: Mood and affect are normal. Normally interactive                                                                                           Assessment & Plan:  #1 dyslipidemia; risks are outlined above. These include the Lp(a) over 30 and the LDL over 100. Risk and options were discussed.  These would include a statin. He describes arthralgias and myalgias on statins.An agent such as Crestor or pravastatin would be less likely to cause this. An option would be to take CoQ10 supplement & Flax seed oil.  The only option to treat the Lp(a)  risk would be niacin 500 mg daily taken with 2 tablespoons of applesauce to mitigate flushing due to its  vasodilating properties.

## 2010-10-16 NOTE — Patient Instructions (Signed)
Please  schedule fasting Labs in 10 weeks : Lipids, hepatic panel, CK( 272.4, 995.20)

## 2010-11-28 ENCOUNTER — Other Ambulatory Visit: Payer: Self-pay | Admitting: Internal Medicine

## 2010-12-04 ENCOUNTER — Encounter: Payer: Self-pay | Admitting: Internal Medicine

## 2010-12-04 ENCOUNTER — Ambulatory Visit (INDEPENDENT_AMBULATORY_CARE_PROVIDER_SITE_OTHER): Payer: Medicare Other | Admitting: Internal Medicine

## 2010-12-04 VITALS — BP 128/80 | HR 82 | Temp 98.9°F | Wt 190.8 lb

## 2010-12-04 DIAGNOSIS — L02419 Cutaneous abscess of limb, unspecified: Secondary | ICD-10-CM

## 2010-12-04 DIAGNOSIS — L0390 Cellulitis, unspecified: Secondary | ICD-10-CM

## 2010-12-04 DIAGNOSIS — L03119 Cellulitis of unspecified part of limb: Secondary | ICD-10-CM

## 2010-12-04 DIAGNOSIS — J310 Chronic rhinitis: Secondary | ICD-10-CM

## 2010-12-04 DIAGNOSIS — J069 Acute upper respiratory infection, unspecified: Secondary | ICD-10-CM

## 2010-12-04 MED ORDER — FLUTICASONE PROPIONATE 50 MCG/ACT NA SUSP: 2.0000 | NASAL | Status: AC

## 2010-12-04 MED ORDER — AMOXICILLIN-POT CLAVULANATE 875-125 MG PO TABS: 1.0000 | ORAL_TABLET | Freq: Two times a day (BID) | ORAL | Status: AC

## 2010-12-04 NOTE — Progress Notes (Signed)
  Subjective:    Patient ID: Daniel Heath, male    DOB: 01/30/1946, 65 y.o.   MRN: [id number]  HPI Lesion: Onset:4 days ago as "bump" Location : L posterior thigh Trigger/injury:no Pain, redness swelling:yes Constitutional: Fever, chills, sweats:no Heme:  lymphadenopathy:no Treatment/response:Bactroban w/o benefit       Review of Systems  He describes URI X 2 weeks. He has had  nasal congestion/obstruction w/o nasal purulence; facial pain;  fatigue; fever; headache; halitosis; earache and dental pain. .      Objective:   Physical Exam General appearance is of good health and nourishment; no acute distress or increased work of breathing is present.  No  lymphadenopathy about the head, neck, or axilla noted.   Eyes: No conjunctival inflammation or lid edema is present. rus.  Ears:  External ear exam shows no significant lesions or deformities.  Otoscopic examination reveals clear canals, tympanic membranes are intact bilaterally without bulging, retraction, inflammation or discharge.  Nose:  External nasal examination shows no deformity or inflammation. Nasal mucosa are pink and moist without lesions or exudates. No septal dislocation or dislocation.No obstruction to airflow.   Oral exam: Dental hygiene is good; lips and gums are healthy appearing.There is no oropharyngeal erythema or exudate noted.   Heart:  Normal rate and regular rhythm. S1 and S2 normal without gallop, murmur, click, rub.S4 Lungs:Chest clear to auscultation; no wheezes, rhonchi,rales ,or rubs present.No increased work of breathing.    Extremities:  No cyanosis, edema, or clubbing  noted    Skin: Warm & dry . There is an area 5 x 6 cm of tender cellulitis of the left posterior thigh. The punctate center was initially closed but  purulent material was able to be expressed with pressure.  The lesion was cleansed with Betadine and alcohol and culture collected.          Assessment & Plan:   #1  cellulitis/abscess left posterior thigh. Culture and sensitivity pending  #2 rhinitis with congestion. Clinically rhinosinusitis is not present.  Plan: See orders and recommendations.

## 2010-12-04 NOTE — Patient Instructions (Signed)
Use warm moist compresses to 3 times a day to the affected area or soak in hot tub

## 2010-12-06 ENCOUNTER — Telehealth: Payer: Self-pay | Admitting: *Deleted

## 2010-12-06 MED ORDER — DOXYCYCLINE HYCLATE 100 MG PO TABS: 100.0000 mg | ORAL_TABLET | Freq: Two times a day (BID) | ORAL | Status: DC

## 2010-12-06 NOTE — Telephone Encounter (Signed)
Pt called says he has been taking augmentin w/ food and still having lots of diarrhea would like to have something else called into pharmacy. Per Dr. Alwyn Ren ok to change medication to Doxy and recommended that pt used Align. Pt aware of instructions.

## 2010-12-08 ENCOUNTER — Telehealth: Payer: Self-pay

## 2010-12-08 LAB — WOUND CULTURE: Gram Stain: NONE SEEN

## 2010-12-08 NOTE — Telephone Encounter (Signed)
Patient aware of results and to take meds until complete

## 2010-12-08 NOTE — Telephone Encounter (Signed)
Message copied by Edgardo Roys on Fri Dec 08, 2010  4:39 PM ------      Message from: Pecola Lawless      Created: Fri Dec 08, 2010  3:32 PM       Doxycycline should be continued for a total of 10 days to eradicate the staph infection.

## 2010-12-12 ENCOUNTER — Telehealth: Payer: Self-pay | Admitting: *Deleted

## 2010-12-12 MED ORDER — DOXYCYCLINE HYCLATE 100 MG PO TABS: 100.0000 mg | ORAL_TABLET | Freq: Two times a day (BID) | ORAL | Status: AC

## 2010-12-12 NOTE — Telephone Encounter (Signed)
Refilled doxycycline 100 mg twice a day dispense 10

## 2010-12-12 NOTE — Telephone Encounter (Signed)
Discuss with patient, Rx sent. 

## 2010-12-12 NOTE — Telephone Encounter (Signed)
Pt indicated that wound is 90% better but he is still c/o drainage, wound yellow in color and still being open. Pt denies any tenderness or pain. Pt has 2 tab of the Doxycycline left but wonder if he will need additional pills since wound is not completely healed Pt given med for total of 10 days for staph infection.  Please advise

## 2011-01-22 ENCOUNTER — Other Ambulatory Visit: Payer: Self-pay | Admitting: Internal Medicine

## 2011-01-22 NOTE — Telephone Encounter (Signed)
Please schedule fasting Labs in 10 weeks : Lipids, hepatic panel, CK( 272.4, 995.20)  COPIED FROM 09/2010 OV INSTRUCTION

## 2011-02-21 ENCOUNTER — Other Ambulatory Visit: Payer: Self-pay | Admitting: Internal Medicine

## 2011-02-21 NOTE — Telephone Encounter (Signed)
Please schedule fasting Labs in 10 weeks : Lipids, hepatic panel, CK( 272.4, 995.20) Copied from 10/16/2010 patient instruction

## 2011-02-22 ENCOUNTER — Other Ambulatory Visit: Payer: Self-pay | Admitting: Internal Medicine

## 2011-02-23 ENCOUNTER — Other Ambulatory Visit: Payer: Self-pay | Admitting: Internal Medicine

## 2011-02-23 DIAGNOSIS — E785 Hyperlipidemia, unspecified: Secondary | ICD-10-CM

## 2011-02-23 DIAGNOSIS — T887XXA Unspecified adverse effect of drug or medicament, initial encounter: Secondary | ICD-10-CM

## 2011-02-26 ENCOUNTER — Other Ambulatory Visit (INDEPENDENT_AMBULATORY_CARE_PROVIDER_SITE_OTHER): Payer: Medicare Other

## 2011-02-26 DIAGNOSIS — T887XXA Unspecified adverse effect of drug or medicament, initial encounter: Secondary | ICD-10-CM | POA: Diagnosis not present

## 2011-02-26 DIAGNOSIS — Z23 Encounter for immunization: Secondary | ICD-10-CM | POA: Diagnosis not present

## 2011-02-26 DIAGNOSIS — E785 Hyperlipidemia, unspecified: Secondary | ICD-10-CM

## 2011-02-26 LAB — LIPID PANEL
HDL: 45 mg/dL (ref >39.00)
LDL Cholesterol: 129 mg/dL — ABNORMAL HIGH (ref 0–99)
Total CHOL/HDL Ratio: 4
VLDL: 19 mg/dL (ref 0.0–40.0)

## 2011-02-27 LAB — HEPATIC FUNCTION PANEL: Total Bilirubin: 0.8 mg/dL (ref 0.3–1.2)

## 2011-03-08 ENCOUNTER — Telehealth: Payer: Self-pay | Admitting: Internal Medicine

## 2011-03-08 MED ORDER — ATORVASTATIN CALCIUM 20 MG PO TABS: 20.0000 mg | ORAL_TABLET | Freq: Every day | ORAL | Status: DC

## 2011-03-08 NOTE — Telephone Encounter (Signed)
Please advise the Crestor to Lipitor change?

## 2011-03-08 NOTE — Telephone Encounter (Signed)
Lipitor 20 mg #90. Check labs after 10 weeks. Please  schedule fasting Labs : Lipids, hepatic panel, CK. PLEASE BRING THESE INSTRUCTIONS TO FOLLOW UP  LAB APPOINTMENT.This will guarantee correct labs are drawn, eliminating need for repeat blood sampling ( needle sticks ! ). Diagnoses /Codes: 409.8,119.14

## 2011-03-08 NOTE — Telephone Encounter (Signed)
Patient states that he was on lipitor but started taking crestor. He states that he had a bad experience while taking crestor and now wants to go back on lipitor. [facility] [address]

## 2011-03-08 NOTE — Telephone Encounter (Signed)
Discuss with patient, Rx sent. 

## 2011-04-19 ENCOUNTER — Telehealth: Payer: Self-pay | Admitting: Internal Medicine

## 2011-04-19 NOTE — Telephone Encounter (Signed)
Per records Pt not due until 07-21-11, Pt was made aware of this per note in computer. Advise Pt of this and GI protocol about sending Pt letter when they are due. Also advise Pt that he may call there office to see if schedules are open that far and schedule a appt. Pt given GI contact info.

## 2011-04-19 NOTE — Telephone Encounter (Signed)
Patient states Dr. Alwyn Ren was supposed to set him up for a colonoscopy. Patient states he has not had one in 10 years and he has discussed this with Dr. Alwyn Ren.

## 2011-05-01 ENCOUNTER — Other Ambulatory Visit: Payer: Self-pay | Admitting: Internal Medicine

## 2011-05-16 ENCOUNTER — Telehealth: Payer: Self-pay | Admitting: Internal Medicine

## 2011-05-16 DIAGNOSIS — T887XXA Unspecified adverse effect of drug or medicament, initial encounter: Secondary | ICD-10-CM

## 2011-05-16 DIAGNOSIS — E785 Hyperlipidemia, unspecified: Secondary | ICD-10-CM

## 2011-05-16 NOTE — Telephone Encounter (Signed)
Patient called & states he was told by hopper to come in for labs since he put him on lipitor. Please put orders in as patient wants to come in on Monday 4.1.13 @ 9:30am  Thanks

## 2011-05-16 NOTE — Telephone Encounter (Signed)
Future Orders placed

## 2011-05-21 ENCOUNTER — Other Ambulatory Visit (INDEPENDENT_AMBULATORY_CARE_PROVIDER_SITE_OTHER): Payer: Medicare Other

## 2011-05-21 DIAGNOSIS — T887XXA Unspecified adverse effect of drug or medicament, initial encounter: Secondary | ICD-10-CM | POA: Diagnosis not present

## 2011-05-21 DIAGNOSIS — E785 Hyperlipidemia, unspecified: Secondary | ICD-10-CM | POA: Diagnosis not present

## 2011-05-21 LAB — LIPID PANEL
Cholesterol: 149 mg/dL (ref 0–200)
HDL: 45.2 mg/dL (ref >39.00)
LDL Cholesterol: 91 mg/dL (ref 0–99)
Total CHOL/HDL Ratio: 3
Triglycerides: 63 mg/dL (ref 0.0–149.0)
VLDL: 12.6 mg/dL (ref 0.0–40.0)

## 2011-05-21 LAB — HEPATIC FUNCTION PANEL
Albumin: 4 g/dL (ref 3.5–5.2)
Total Protein: 6.7 g/dL (ref 6.0–8.3)

## 2011-05-21 LAB — CK: Total CK: 72 U/L (ref 7–232)

## 2011-05-25 ENCOUNTER — Encounter: Payer: Self-pay | Admitting: Gastroenterology

## 2011-06-05 ENCOUNTER — Encounter: Payer: Self-pay | Admitting: General Practice

## 2011-06-05 ENCOUNTER — Other Ambulatory Visit: Payer: Self-pay | Admitting: Internal Medicine

## 2011-06-18 ENCOUNTER — Telehealth: Payer: Self-pay | Admitting: Internal Medicine

## 2011-06-18 NOTE — Telephone Encounter (Signed)
Patient called and states he would like to discuss Rx for Celebrex, with dr.hopper Please call at [phone]

## 2011-06-18 NOTE — Telephone Encounter (Signed)
Left message to call office

## 2011-06-18 NOTE — Telephone Encounter (Signed)
Pt states that he has been having some pain in his back, neck and legs and would like to know if Dr Alwyn Ren would Rx celebrex for him. Pt advise OV due to not being seen for this previously. OV scheduled.

## 2011-06-20 ENCOUNTER — Ambulatory Visit (INDEPENDENT_AMBULATORY_CARE_PROVIDER_SITE_OTHER): Payer: Medicare Other | Admitting: Internal Medicine

## 2011-06-20 ENCOUNTER — Encounter: Payer: Self-pay | Admitting: Internal Medicine

## 2011-06-20 VITALS — BP 124/80 | HR 81 | Temp 97.7°F | Wt 194.0 lb

## 2011-06-20 DIAGNOSIS — M546 Pain in thoracic spine: Secondary | ICD-10-CM

## 2011-06-20 MED ORDER — CELECOXIB 200 MG PO CAPS: ORAL_CAPSULE | ORAL | Status: DC

## 2011-06-20 NOTE — Progress Notes (Signed)
  Subjective:    Patient ID: Daniel Heath, male    DOB: [birth year], 66 y.o.   MRN: [id number]  HPI 3 weeks ago after playing golf he began to experience stiffness from low back up to trapezius areas. He's had partial response to 3 ibuprofen each morning.  There's been no associated rash, fever, chills, unexplained weight loss. He  also denies numbness and tingling or weakness in his limbs.  There's been no incontinence of urine or stool   Review of Systems there's been no cough, chest pain, shortness of breath. Despite the nonsteroidal, he denies abdominal pain, melena or rectal bleeding.     Objective:   Physical Exam  He appears healthy and well-nourished in no acute distress  There is full range of motion of the neck.  Strength and tone are normal in all extremities.  Deep tendon reflexes are brisk and equal  Chest is clear to auscultation. There is no pain to percussion over the thoracic spine or with compression of the thorax  Gait including heel and toe walking is normal.  There is no malalignment of the spine. There are no suspicious rash or skin lesions present          Assessment & Plan:  #1 musculoskeletal back discomfort with no neuromuscular deficit   Plan: Arthritis  Strength Tylenol supplemented by Celebrex 200 mg daily as needed.

## 2011-06-20 NOTE — Patient Instructions (Signed)
The best exercises for the low back include freestyle swimming, stretch aerobics, and yoga. 

## 2011-06-21 ENCOUNTER — Ambulatory Visit: Payer: Medicare Other | Admitting: Internal Medicine

## 2011-07-23 ENCOUNTER — Ambulatory Visit (AMBULATORY_SURGERY_CENTER): Payer: Medicare Other | Admitting: *Deleted

## 2011-07-23 VITALS — Ht 69.0 in | Wt 192.0 lb

## 2011-07-23 DIAGNOSIS — Z1211 Encounter for screening for malignant neoplasm of colon: Secondary | ICD-10-CM

## 2011-07-23 MED ORDER — PEG-KCL-NACL-NASULF-NA ASC-C 100 G PO SOLR: ORAL | Status: DC

## 2011-08-06 ENCOUNTER — Ambulatory Visit (AMBULATORY_SURGERY_CENTER): Payer: Medicare Other | Admitting: Gastroenterology

## 2011-08-06 ENCOUNTER — Encounter: Payer: Self-pay | Admitting: Gastroenterology

## 2011-08-06 VITALS — BP 154/95 | HR 76 | Temp 97.2°F | Resp 18 | Ht 69.0 in | Wt 192.0 lb

## 2011-08-06 DIAGNOSIS — D126 Benign neoplasm of colon, unspecified: Secondary | ICD-10-CM

## 2011-08-06 DIAGNOSIS — Z1211 Encounter for screening for malignant neoplasm of colon: Secondary | ICD-10-CM

## 2011-08-06 DIAGNOSIS — K5730 Diverticulosis of large intestine without perforation or abscess without bleeding: Secondary | ICD-10-CM

## 2011-08-06 MED ORDER — SODIUM CHLORIDE 0.9 % IV SOLN: 500.0000 mL | INTRAVENOUS | Status: DC

## 2011-08-06 NOTE — Patient Instructions (Addendum)

## 2011-08-06 NOTE — Progress Notes (Signed)
Patient did not experience any of the following events: a burn prior to discharge; a fall within the facility; wrong site/side/patient/procedure/implant event; or a hospital transfer or hospital admission upon discharge from the facility. (G8907) Patient did not have preoperative order for IV antibiotic SSI prophylaxis. (G8918)  

## 2011-08-06 NOTE — Op Note (Signed)
Daniel Heath, Daniel Heath, Daniel Heath  MR#:  [id number] BIRTHDATE:  August 29, 1945, 65 yrs. old  GENDER:  male ENDOSCOPIST:  Barbette Hair. Arlyce Dice, MD REF. BY: PROCEDURE DATE:  08/06/2011 PROCEDURE:  Colonoscopy with snare polypectomy ASA CLASS:  Class II INDICATIONS:  Routine Risk Screening MEDICATIONS:   MAC sedation, administered by CRNA propofol 280mg IV  DESCRIPTION OF PROCEDURE:   After the risks benefits and alternatives of the procedure were thoroughly explained, informed consent was obtained.  Digital rectal exam was performed and revealed no abnormalities.   The LB CF-[id number] endoscope was introduced through the anus and advanced to the cecum, which was identified by both the appendix and ileocecal valve, without limitations.  The quality of the prep was excellent, using MoviPrep.  The instrument was then slowly withdrawn as the colon was fully examined. <<PROCEDUREIMAGES>>  FINDINGS:  A sessile polyp was found in the proximal transverse colon. It was 2 mm in size. Polyp was snared without cautery. Retrieval was successful (see image4). snare polyp  Severe diverticulosis was found in the sigmoid colon. Moderately severe lumenal narrowing and muscular hypertrophy (see image6).  This was otherwise a normal examination of the colon (see image3 and image8).   Retroflexed views in the rectum revealed no abnormalities.    The time to cecum =  1) 8.50  minutes. The scope was then withdrawn in  1) 9.25  minutes from the cecum and the procedure completed. COMPLICATIONS:  None ENDOSCOPIC IMPRESSION: 1) 2 mm sessile polyp in the proximal transverse colon 2) Severe diverticulosis in the sigmoid colon 3) Otherwise normal examination RECOMMENDATIONS: 1) If the polyp(s) removed today are proven to be adenomatous (pre-cancerous) polyps, you will need a repeat colonoscopy in 5 years. Otherwise you  should continue to follow colorectal cancer screening guidelines for "routine risk" patients with colonoscopy in 10 years. You will receive a letter within 1-2 weeks with the results of your biopsy as well as final recommendations. Please call my office if you have not received a letter after 3 weeks. REPEAT EXAM:   You will receive a letter from Dr. Arlyce Dice in 1-2 weeks, after reviewing the final pathology, with followup recommendations.  ______________________________ Barbette Hair Arlyce Dice, MD  CC:  Pecola Lawless, MD  n. Rosalie Doctor:   Barbette Hair. Ivery Nanney at 08/06/2011 10:16 AM  [place], [phone]

## 2011-08-07 ENCOUNTER — Telehealth: Payer: Self-pay | Admitting: *Deleted

## 2011-08-07 NOTE — Telephone Encounter (Signed)
  Follow up Call-  Call back number 08/06/2011  Post procedure Call Back phone  # [phone]  Permission to leave phone message Yes     Patient questions:  Do you have a fever, pain , or abdominal swelling? no Pain Score  0 *  Have you tolerated food without any problems? yes  Have you been able to return to your normal activities? yes  Do you have any questions about your discharge instructions: Diet   no Medications  no Follow up visit  no  Do you have questions or concerns about your Care? no  Actions: * If pain score is 4 or above: No action needed, pain <4.

## 2011-08-10 ENCOUNTER — Encounter: Payer: Self-pay | Admitting: Gastroenterology

## 2011-08-30 ENCOUNTER — Other Ambulatory Visit: Payer: Self-pay | Admitting: Internal Medicine

## 2011-09-04 ENCOUNTER — Other Ambulatory Visit: Payer: Self-pay | Admitting: Internal Medicine

## 2011-11-09 ENCOUNTER — Other Ambulatory Visit: Payer: Self-pay | Admitting: Internal Medicine

## 2011-11-09 ENCOUNTER — Telehealth: Payer: Self-pay | Admitting: Internal Medicine

## 2011-11-09 DIAGNOSIS — L0292 Furuncle, unspecified: Secondary | ICD-10-CM

## 2011-11-09 MED ORDER — CEPHALEXIN 500 MG PO CAPS: 500.0000 mg | ORAL_CAPSULE | Freq: Two times a day (BID) | ORAL | Status: DC

## 2011-11-09 MED ORDER — MUPIROCIN 2 % EX OINT: TOPICAL_OINTMENT | CUTANEOUS | Status: DC

## 2011-11-09 NOTE — Telephone Encounter (Signed)
Caller: Daniel Heath/Patient; Phone: [phone]; Reason for Call: Patient spoke with a nurse earlier about a boil, states [name] was called in but he was wanting antibiotic instead of a cream.  Please call him back.  Thanks

## 2011-11-09 NOTE — Telephone Encounter (Signed)
Dr.Hopper please advise and send response to Triage

## 2011-11-09 NOTE — Telephone Encounter (Signed)
[facility]

## 2011-11-09 NOTE — Telephone Encounter (Signed)
Caller: [place]/Patient; Patient Name: Daniel Heath; PCP: Marga Melnick; Best Callback Phone Number: [phone]; Reason for call: Has a boil on the inside of left ear. Onset 11/09/11; Has history of recurrent boil and has been dealing with them himself, but with position of this in his ear,  he is not sure that he will be able to. May be about the size of small fingernail. States the usual course it that it will continue to get bigger come to a head and drain.  Patient is hoping to prevent this-does not want that drainage to get on his eardrum.  Afebrile  Hopes to have something called in like Bactroban or Augmentin that Dr. Alwyn Ren has done in the past.  Triaged using Skin lesion with a disposition to see provider within 24 hours due to new signs and symptoms of local infection. Patient declined appointment hoping that provider with [facility] [address] at [phone]. Care advice given-patient was already familiar. Last appointment 06/20/11.  OFFICE: PLEASE FOLLOW UP WITH PATIENT REGARDING TREATMENT FOR BOIL IN LEFT EAR. DECLINED APPOINTMENT. IS REQUESTING OINTMENT (BACTROBAN PER RECORD) OR ORAL ANTIBIOTIC SINCE THE LOCATION OF THIS MAY MAKE IT DIFFICULT TO TREAT AS HE HAS IN THE PAST.  THANKS.

## 2011-11-09 NOTE — Telephone Encounter (Signed)
Pt aware.

## 2011-11-10 ENCOUNTER — Ambulatory Visit (INDEPENDENT_AMBULATORY_CARE_PROVIDER_SITE_OTHER): Payer: Medicare Other | Admitting: Family Medicine

## 2011-11-10 VITALS — BP 150/92 | HR 67 | Temp 98.0°F | Resp 18 | Ht 68.0 in | Wt 190.0 lb

## 2011-11-10 DIAGNOSIS — H60399 Other infective otitis externa, unspecified ear: Secondary | ICD-10-CM

## 2011-11-10 DIAGNOSIS — H6000 Abscess of external ear, unspecified ear: Secondary | ICD-10-CM

## 2011-11-10 MED ORDER — DOXYCYCLINE HYCLATE 100 MG PO TABS: 100.0000 mg | ORAL_TABLET | Freq: Two times a day (BID) | ORAL | Status: DC

## 2011-11-10 NOTE — Progress Notes (Signed)
66 yo patient of Dr. 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  He works as a Paediatric nurse.  This episode began several days ago but is no longer painful  Objective:  Normal conversational hearing, NAD Inner tragus is swollen and has thick purulent discharge coming from the abscess.  It is nontender, and I expressed pus until swelling was decreased, although there is still some induration in the area.  TM appears normal Gait normal  Assessment:  Recurring left ear canal abscess  Plan:  Continue the bactroban and start doxycycline 100 bid x 10 days.   Instructed to continue trying to milk pus out of the abscess. Follow up with Dr. Alwyn Ren in next 2 weeks when cpe due, consider ENT referral at that time 1. Abscess, ear canal  doxycycline (VIBRA-TABS) 100 MG tablet

## 2011-11-29 ENCOUNTER — Other Ambulatory Visit: Payer: Self-pay | Admitting: Internal Medicine

## 2011-12-11 ENCOUNTER — Telehealth: Payer: Self-pay | Admitting: Internal Medicine

## 2011-12-11 NOTE — Telephone Encounter (Signed)
Caller: [place]/Patient; Patient Name: Daniel Heath; PCP: Marga Melnick; Best Callback Phone Number: [phone]. Call regarding sinus drainage (clear discharge) and productive cough. Onset approximately 2 weeks. Reports coughing spasms that cause shortness of breath at times. Afebrile. Has been using Neti Pot and Mucinex to help with symptoms. Triaged per Cough - Adult guideline, disposition: see provider within 24 hours for "Recurrent episodes of uncontrolled coughing interfering with ability to carry out usual activities or with normal sleep patterns." Appointment scheduled on 12/12/11 at 11:30am with Dr. Alwyn Ren. Care advice and call back parameters given per guideline. Patient verbalized understanding.

## 2011-12-11 NOTE — Telephone Encounter (Signed)
Noted, per Dr.Hopper's protocol if patient with scheduled appointment or sent to emergency room or urgent care ok to close encounter

## 2011-12-12 ENCOUNTER — Encounter: Payer: Self-pay | Admitting: Internal Medicine

## 2011-12-12 ENCOUNTER — Ambulatory Visit (INDEPENDENT_AMBULATORY_CARE_PROVIDER_SITE_OTHER): Payer: Medicare Other | Admitting: Internal Medicine

## 2011-12-12 VITALS — BP 132/80 | HR 58 | Temp 97.9°F | Wt 193.4 lb

## 2011-12-12 DIAGNOSIS — J209 Acute bronchitis, unspecified: Secondary | ICD-10-CM

## 2011-12-12 MED ORDER — HYDROCODONE-HOMATROPINE 5-1.5 MG/5ML PO SYRP: 5.0000 mL | ORAL_SOLUTION | Freq: Four times a day (QID) | ORAL | Status: DC | PRN

## 2011-12-12 MED ORDER — AZITHROMYCIN 250 MG PO TABS: ORAL_TABLET | ORAL | Status: DC

## 2011-12-12 MED ORDER — MOMETASONE FUROATE 50 MCG/ACT NA SUSP: NASAL | Status: DC

## 2011-12-12 NOTE — Patient Instructions (Addendum)
Plain Mucinex for thick secretions ;force NON dairy fluids . Use a Neti pot daily as needed for sinus congestion; going from open side to congested side . Nasal cleansing in the shower as discussed. Make sure that all residual soap is removed to prevent irritation. Naonex 1 spray in each nostril twice a day as needed. Use the "crossover" technique as discussed. Plain Allegra 160 daily as needed for itchy eyes & sneezing.

## 2011-12-12 NOTE — Progress Notes (Signed)
  Subjective:    Patient ID: Daniel Heath, male    DOB: [birth year], 66 y.o.   MRN: [id number]  HPI Symptoms began 2 weeks ago after deer hunting as sinus pressure. This was followed by significant postnasal drainage and cough. The cough is paroxysmal to the point he's had shortness of breath.  He's been using nasal lavage and Mucinex with initial response then relapse after 7 days. He states he feels 75% better today.     Review of Systems  He denies any associated extrinsic symptoms such as itchy, watery eyes or sneezing. He is not having fever, chills, or sweats. There's no associated frontal headache, facial pain,dental pain , earache, sore throat or nasal purulence. The cough has remained nonproductive despite the sensation of significant chest congestion      Objective:   Physical Exam General appearance:good health ;well nourished; no acute distress or increased work of breathing is present.  No  lymphadenopathy about the head, neck, or axilla noted.   Eyes: No conjunctival inflammation or lid edema is present.   Ears:  External ear exam shows no significant lesions or deformities.  Otoscopic examination reveals clear canals, tympanic membranes are intact bilaterally without bulging, retraction, inflammation or discharge.  Nose:  External nasal examination shows no deformity or inflammation. Nasal mucosa are erythematous without lesions or exudates. No septal dislocation or deviation.No obstruction to airflow.   Oral exam: Dental hygiene is good; lips and gums are healthy appearing.There is no oropharyngeal erythema or exudate noted.   Neck:  No deformities,  masses, or tenderness noted.     Heart:  Normal rate and regular rhythm. S1 and S2 normal without gallop, murmur, click, rub or other extra sounds.   Lungs:Chest clear to auscultation; no wheezes, rhonchi,rales ,or rubs present.No increased work of breathing.  Dry cough  Extremities:  No cyanosis, edema, or clubbing  noted     Skin: Warm & dry           Assessment & Plan:  #1 acute bronchitis w/o bronchospasm Plan: See orders and recommendations

## 2012-02-04 ENCOUNTER — Ambulatory Visit (INDEPENDENT_AMBULATORY_CARE_PROVIDER_SITE_OTHER): Payer: Medicare Other

## 2012-02-04 DIAGNOSIS — Z23 Encounter for immunization: Secondary | ICD-10-CM | POA: Diagnosis not present

## 2012-02-26 ENCOUNTER — Other Ambulatory Visit: Payer: Self-pay | Admitting: Internal Medicine

## 2012-03-04 ENCOUNTER — Other Ambulatory Visit: Payer: Self-pay | Admitting: Internal Medicine

## 2012-04-14 ENCOUNTER — Ambulatory Visit (INDEPENDENT_AMBULATORY_CARE_PROVIDER_SITE_OTHER): Payer: Medicare Other | Admitting: Internal Medicine

## 2012-04-14 ENCOUNTER — Encounter: Payer: Self-pay | Admitting: Internal Medicine

## 2012-04-14 VITALS — BP 130/80 | HR 78 | Temp 98.1°F | Resp 12 | Ht 69.0 in | Wt 187.0 lb

## 2012-04-14 DIAGNOSIS — E785 Hyperlipidemia, unspecified: Secondary | ICD-10-CM

## 2012-04-14 DIAGNOSIS — I10 Essential (primary) hypertension: Secondary | ICD-10-CM | POA: Diagnosis not present

## 2012-04-14 DIAGNOSIS — N429 Disorder of prostate, unspecified: Secondary | ICD-10-CM

## 2012-04-14 DIAGNOSIS — Z0000 Encounter for general adult medical examination without abnormal findings: Secondary | ICD-10-CM | POA: Diagnosis not present

## 2012-04-14 DIAGNOSIS — K5730 Diverticulosis of large intestine without perforation or abscess without bleeding: Secondary | ICD-10-CM | POA: Diagnosis not present

## 2012-04-14 NOTE — Patient Instructions (Addendum)
Please  schedule fasting Labs : BMET,Lipids, hepatic panel, CBC & dif, TSH, PSA. PLEASE BRING THESE INSTRUCTIONS TO FOLLOW UP  LAB APPOINTMENT.This will guarantee correct labs are drawn, eliminating need for repeat blood sampling ( needle sticks ! ). Diagnoses /Codes: 562.10,401.9,272.4,401.9,602.9 If you activate the  My Chart system; lab & Xray results will be released directly  to you as soon as I review & address these through the computer. As per [facility] all records must be reviewed and signed off within 72 hours; but I attempt to complete this within 36 hours unless I have no access to the electronic medical record.If I wait more than 36 hours the volume of reports becomes difficult to manage optimally. In my absence ;my partners would address the results.Critical lab results will be communicated to you ASAP. If you choose not to sign up for My Chart within 36 hours of labs being drawn; results will be reviewed & interpretation added before being copied & mailed, causing a delay in getting the results to you.If you do not receive that report within 7-10 days ,please call. Additionally you can use this system to gain direct  access to your records  if  out of town or @ an office of a  physician who is not in  the My Chart network.  This improves continuity of care & places you in control of your medical record.

## 2012-04-14 NOTE — Progress Notes (Signed)
Subjective:    Patient ID: Daniel Heath, male    DOB: April 25, 1945, 67 y.o.   MRN: [id number]  HPI Medicare Wellness Visit:  Psychosocial & medical history were reviewed as required by Medicare (abuse,antisocial behavioral risks,firearm risk).  Social history: caffeine:none  , alcohol:  3-4/ week ,  tobacco use:  Quit 1970  Exercise :  Walking 2 mpd 4 X/ week No home & personal  safety / fall risk Activities of daily living: no limitations  Seatbelt  and smoke alarm employed. Power of Attorney/Living Will status :? Not sure about Health Care POA Ophthalmology exam not current Hearing evaluation not current Orientation :oriented X 3  Memory & recall :good Spelling  testing:good Mood & affect : normal . Depression / anxiety: denied Travel history : last 1966-70 [place]  Immunization status :? tetanus needed Transfusion history:  none  Preventive health surveillance ( colonoscopy as per protocol/ [facility]): current  Dental care:  Every 6 mos. Chart reviewed &  Updated. Active issues reviewed & addressed.      Review of Systems HYPERTENSION: Disease Monitoring: Blood pressure range-not monitored Chest pain, palpitations- no Dyspnea- no Medications: Compliance-yes Lightheadedness,Syncope-some positional symptoms    Edema- no  HYPERLIPIDEMIA: Disease Monitoring: Medications: Compliance- yes  Abd pain, bowel changes-no   Muscle aches- no        Objective:   Physical Exam Gen.:well-nourished in appearance. Alert, appropriate and cooperative throughout exam. Head: Normocephalic without obvious abnormalities. Goatee Eyes: No corneal or conjunctival inflammation noted. Pupils equal round reactive to light and accommodation.  Extraocular motion intact. Vision grossly normal without  lenses Ears: External  ear exam reveals no significant lesions or deformities. Canals clear .TMs normal. Hearing is grossly normal bilaterally. Nose: External nasal exam reveals no deformity or  inflammation. Nasal mucosa are pink and moist. No lesions or exudates noted.  Mouth: Oral mucosa and oropharynx reveal no lesions or exudates. Teeth in good repair. Neck: No deformities, masses, or tenderness noted. Range of motion & Thyroid normal. Lungs: Normal respiratory effort; chest expands symmetrically. Lungs are clear to auscultation without rales, wheezes, or increased work of breathing. Heart: Normal rate and rhythm. Normal S1 and S2. No gallop, click, or rub. S4 w/o murmur. Abdomen: Bowel sounds normal; abdomen soft and nontender. No masses, organomegaly or hernias noted. Genitalia: Genitalia normal . Prostate is asymmetrically  Enlarged on L w/o nodularity or induration.                               Musculoskeletal/extremities: No deformity or scoliosis noted of  the thoracic or lumbar spine.  No clubbing, cyanosis, edema, or significant extremity  deformity noted. Range of motion normal .Tone & strength  Normal. Joints normal . Nail health good. Able to lie down & sit up w/o help. Negative SLR bilaterally Vascular: Carotid, radial artery, dorsalis pedis and  posterior tibial pulses are full and equal. No bruits present. Neurologic: Alert and oriented x3. Deep tendon reflexes symmetrical and normal.          Skin: Intact without suspicious lesions or rashes. Lymph: No cervical, axillary, or inguinal lymphadenopathy present. Psych: Mood and affect are normal. Normally interactive  Assessment & Plan:  #1 Medicare Wellness Exam; criteria met ; data entered #2 Problem List reviewed ; Assessment/ Recommendations made Plan: see Orders

## 2012-04-21 ENCOUNTER — Other Ambulatory Visit (INDEPENDENT_AMBULATORY_CARE_PROVIDER_SITE_OTHER): Payer: Medicare Other

## 2012-04-21 DIAGNOSIS — I10 Essential (primary) hypertension: Secondary | ICD-10-CM

## 2012-04-21 DIAGNOSIS — K261 Acute duodenal ulcer with perforation: Secondary | ICD-10-CM | POA: Diagnosis not present

## 2012-04-21 DIAGNOSIS — E785 Hyperlipidemia, unspecified: Secondary | ICD-10-CM | POA: Diagnosis not present

## 2012-04-21 DIAGNOSIS — N429 Disorder of prostate, unspecified: Secondary | ICD-10-CM | POA: Diagnosis not present

## 2012-04-21 LAB — CBC WITH DIFFERENTIAL/PLATELET
Basophils Relative: 0.8 % (ref 0.0–3.0)
Eosinophils Absolute: 0.2 10*3/uL (ref 0.0–0.7)
HCT: 48.3 % (ref 39.0–52.0)
Hemoglobin: 16.4 g/dL (ref 13.0–17.0)
Lymphs Abs: 0.9 10*3/uL (ref 0.7–4.0)
MCHC: 33.9 g/dL (ref 30.0–36.0)
MCV: 93.7 fl (ref 78.0–100.0)
Monocytes Absolute: 0.5 10*3/uL (ref 0.1–1.0)
Neutro Abs: 3 10*3/uL (ref 1.4–7.7)
RBC: 5.15 Mil/uL (ref 4.22–5.81)

## 2012-04-21 LAB — HEPATIC FUNCTION PANEL
ALT: 25 U/L (ref 0–53)
AST: 27 U/L (ref 0–37)
Albumin: 3.8 g/dL (ref 3.5–5.2)
Alkaline Phosphatase: 77 U/L (ref 39–117)
Bilirubin, Direct: 0.2 mg/dL (ref 0.0–0.3)
Total Bilirubin: 1.2 mg/dL (ref 0.3–1.2)
Total Protein: 6.7 g/dL (ref 6.0–8.3)

## 2012-04-21 LAB — LIPID PANEL
Cholesterol: 146 mg/dL (ref 0–200)
HDL: 41.5 mg/dL (ref >39.00)
LDL Cholesterol: 97 mg/dL (ref 0–99)
Total CHOL/HDL Ratio: 4
Triglycerides: 37 mg/dL (ref 0.0–149.0)
VLDL: 7.4 mg/dL (ref 0.0–40.0)

## 2012-04-21 LAB — BASIC METABOLIC PANEL
BUN: 20 mg/dL (ref 6–23)
Chloride: 106 mEq/L (ref 96–112)
Creatinine, Ser: 0.9 mg/dL (ref 0.4–1.5)
GFR: 84.97 mL/min (ref >60.00)
Glucose, Bld: 99 mg/dL (ref 70–99)
Potassium: 4.4 mEq/L (ref 3.5–5.1)

## 2012-06-03 ENCOUNTER — Other Ambulatory Visit: Payer: Self-pay | Admitting: Internal Medicine

## 2012-06-18 ENCOUNTER — Other Ambulatory Visit: Payer: Self-pay | Admitting: Internal Medicine

## 2012-07-28 ENCOUNTER — Ambulatory Visit (INDEPENDENT_AMBULATORY_CARE_PROVIDER_SITE_OTHER): Payer: Medicare Other | Admitting: Family Medicine

## 2012-07-28 ENCOUNTER — Encounter: Payer: Self-pay | Admitting: Family Medicine

## 2012-07-28 VITALS — BP 130/70 | HR 71 | Temp 98.1°F | Wt 183.2 lb

## 2012-07-28 DIAGNOSIS — H6122 Impacted cerumen, left ear: Secondary | ICD-10-CM | POA: Insufficient documentation

## 2012-07-28 DIAGNOSIS — H60392 Other infective otitis externa, left ear: Secondary | ICD-10-CM

## 2012-07-28 DIAGNOSIS — H6120 Impacted cerumen, unspecified ear: Secondary | ICD-10-CM | POA: Diagnosis not present

## 2012-07-28 DIAGNOSIS — H60399 Other infective otitis externa, unspecified ear: Secondary | ICD-10-CM

## 2012-07-28 MED ORDER — NEOMYCIN-COLIST-HC-THONZONIUM 3.3-3-10-0.5 MG/ML OT SUSP: 3.0000 [drp] | Freq: Four times a day (QID) | OTIC | Status: DC

## 2012-07-28 NOTE — Progress Notes (Signed)
  Subjective:    Patient ID: Daniel Heath, male    DOB: 15-Oct-1945, 67 y.o.   MRN: [id number]  HPI Pt here c/o L ear feeling full  He used otc gtts to try to help but it is not working.   No other complaints.    Review of Systems    as above Objective:   Physical Exam  BP 130/70  Pulse 71  Temp(Src) 98.1 F (36.7 C) (Oral)  Wt 183 lb 3.2 oz (83.099 kg)  BMI 27.04 kg/m2  SpO2 97% General appearance: alert, cooperative and no distress Ears: + cerumen impaction L ear---unable to remove with hoop or irrigation. Nose: Nares normal. Septum midline. Mucosa normal. No drainage or sinus tenderness. Throat: lips, mucosa, and tongue normal; teeth and gums normal       Assessment & Plan:

## 2012-07-28 NOTE — Patient Instructions (Addendum)
Cerumen Impaction A cerumen impaction is when the wax in your ear forms a plug. This plug usually causes reduced hearing. Sometimes it also causes an earache or dizziness. Removing a cerumen impaction can be difficult and painful. The wax sticks to the ear canal. The canal is sensitive and bleeds easily. If you try to remove a heavy wax buildup with a cotton tipped swab, you may push it in further. Irrigation with water, suction, and small ear curettes may be used to clear out the wax. If the impaction is fixed to the skin in the ear canal, ear drops may be needed for a few days to loosen the wax. People who build up a lot of wax frequently can use ear wax removal products available in your local drugstore. SEEK MEDICAL CARE IF:  You develop an earache, increased hearing loss, or marked dizziness. Document Released: 03/15/2004 Document Revised: 04/30/2011 Document Reviewed: 05/05/2009 Exit[employer] Patient Information 2014 [employer].  

## 2012-07-28 NOTE — Assessment & Plan Note (Signed)
Debrox and abx gtts L ear Refer to ENT for removal

## 2012-07-31 ENCOUNTER — Ambulatory Visit: Payer: Medicare Other | Admitting: Family Medicine

## 2012-08-01 DIAGNOSIS — H60399 Other infective otitis externa, unspecified ear: Secondary | ICD-10-CM | POA: Diagnosis not present

## 2012-08-01 DIAGNOSIS — H6120 Impacted cerumen, unspecified ear: Secondary | ICD-10-CM | POA: Diagnosis not present

## 2012-08-29 ENCOUNTER — Other Ambulatory Visit: Payer: Self-pay | Admitting: Internal Medicine

## 2012-12-09 DIAGNOSIS — Z23 Encounter for immunization: Secondary | ICD-10-CM | POA: Diagnosis not present

## 2012-12-24 ENCOUNTER — Other Ambulatory Visit: Payer: Self-pay | Admitting: Internal Medicine

## 2012-12-24 NOTE — Telephone Encounter (Signed)
Celebrex refill sent to pharmacy

## 2013-02-27 ENCOUNTER — Other Ambulatory Visit: Payer: Self-pay | Admitting: Internal Medicine

## 2013-02-27 NOTE — Telephone Encounter (Signed)
Rx's sent to the pharmacy by e-script.//AB/CMA 

## 2013-05-13 DIAGNOSIS — J019 Acute sinusitis, unspecified: Secondary | ICD-10-CM | POA: Diagnosis not present

## 2013-05-25 ENCOUNTER — Other Ambulatory Visit (INDEPENDENT_AMBULATORY_CARE_PROVIDER_SITE_OTHER): Payer: Medicare Other

## 2013-05-25 ENCOUNTER — Encounter: Payer: Self-pay | Admitting: Internal Medicine

## 2013-05-25 ENCOUNTER — Ambulatory Visit (INDEPENDENT_AMBULATORY_CARE_PROVIDER_SITE_OTHER): Payer: Medicare Other | Admitting: Internal Medicine

## 2013-05-25 VITALS — BP 120/88 | HR 72 | Temp 97.8°F | Resp 14 | Ht 69.0 in | Wt 185.0 lb

## 2013-05-25 DIAGNOSIS — K219 Gastro-esophageal reflux disease without esophagitis: Secondary | ICD-10-CM | POA: Diagnosis not present

## 2013-05-25 DIAGNOSIS — I10 Essential (primary) hypertension: Secondary | ICD-10-CM | POA: Diagnosis not present

## 2013-05-25 DIAGNOSIS — N429 Disorder of prostate, unspecified: Secondary | ICD-10-CM

## 2013-05-25 DIAGNOSIS — Z23 Encounter for immunization: Secondary | ICD-10-CM

## 2013-05-25 DIAGNOSIS — E785 Hyperlipidemia, unspecified: Secondary | ICD-10-CM | POA: Diagnosis not present

## 2013-05-25 DIAGNOSIS — K635 Polyp of colon: Secondary | ICD-10-CM | POA: Insufficient documentation

## 2013-05-25 LAB — HEPATIC FUNCTION PANEL
ALK PHOS: 72 U/L (ref 39–117)
ALT: 26 U/L (ref 0–53)
AST: 26 U/L (ref 0–37)
Albumin: 4 g/dL (ref 3.5–5.2)
Bilirubin, Direct: 0.1 mg/dL (ref 0.0–0.3)
Total Bilirubin: 0.9 mg/dL (ref 0.3–1.2)
Total Protein: 6.7 g/dL (ref 6.0–8.3)

## 2013-05-25 LAB — BASIC METABOLIC PANEL
BUN: 17 mg/dL (ref 6–23)
CALCIUM: 9.3 mg/dL (ref 8.4–10.5)
CO2: 29 mEq/L (ref 19–32)
Chloride: 102 mEq/L (ref 96–112)
Creatinine, Ser: 0.8 mg/dL (ref 0.4–1.5)
GFR: 108.23 mL/min (ref >60.00)
Glucose, Bld: 105 mg/dL — ABNORMAL HIGH (ref 70–99)
Potassium: 4.4 mEq/L (ref 3.5–5.1)
SODIUM: 139 meq/L (ref 135–145)

## 2013-05-25 LAB — LIPID PANEL
CHOL/HDL RATIO: 3
Cholesterol: 153 mg/dL (ref 0–200)
HDL: 47.3 mg/dL (ref >39.00)
LDL CALC: 98 mg/dL (ref 0–99)
Triglycerides: 41 mg/dL (ref 0.0–149.0)
VLDL: 8.2 mg/dL (ref 0.0–40.0)

## 2013-05-25 LAB — CBC WITH DIFFERENTIAL/PLATELET
BASOS ABS: 0 10*3/uL (ref 0.0–0.1)
BASOS PCT: 0.5 % (ref 0.0–3.0)
Eosinophils Absolute: 0.1 10*3/uL (ref 0.0–0.7)
Eosinophils Relative: 1.7 % (ref 0.0–5.0)
HCT: 48.8 % (ref 39.0–52.0)
HEMOGLOBIN: 16.7 g/dL (ref 13.0–17.0)
Lymphocytes Relative: 19.5 % (ref 12.0–46.0)
Lymphs Abs: 1 10*3/uL (ref 0.7–4.0)
MCHC: 34.3 g/dL (ref 30.0–36.0)
MCV: 94.1 fl (ref 78.0–100.0)
MONOS PCT: 9 % (ref 3.0–12.0)
Monocytes Absolute: 0.5 10*3/uL (ref 0.1–1.0)
NEUTROS ABS: 3.5 10*3/uL (ref 1.4–7.7)
Neutrophils Relative %: 69.3 % (ref 43.0–77.0)
Platelets: 232 10*3/uL (ref 150.0–400.0)
RBC: 5.18 Mil/uL (ref 4.22–5.81)
RDW: 12.6 % (ref 11.5–14.6)
WBC: 5.1 10*3/uL (ref 4.5–10.5)

## 2013-05-25 LAB — PSA: PSA: 2.24 ng/mL (ref 0.10–4.00)

## 2013-05-25 LAB — TSH: TSH: 2.51 u[IU]/mL (ref 0.35–5.50)

## 2013-05-25 NOTE — Assessment & Plan Note (Signed)
CBC & dif  Anti reflux measures PPI pre b'fast prn   

## 2013-05-25 NOTE — Patient Instructions (Addendum)
Your next office appointment will be determined based upon review of your pending labs . Those instructions will be transmitted to you  by mail. Reflux of gastric acid may be asymptomatic as this may occur mainly during sleep.The triggers for reflux  include stress; the "aspirin family" ; alcohol; peppermint; and caffeine (coffee, tea, cola, and chocolate). The aspirin family would include aspirin and the nonsteroidal agents such as ibuprofen &  Naproxen. Tylenol would not cause reflux. If having symptoms ; food & drink should be avoided for @ least 2 hours before going to bed. Reflux of gastric acid may be asymptomatic as this may occur mainly during sleep.The triggers for reflux  include stress; the "aspirin family" ; alcohol; peppermint; and caffeine (coffee, tea, cola, and chocolate). The aspirin family would include aspirin and the nonsteroidal agents such as ibuprofen &  Naproxen. Tylenol would not cause reflux. If having symptoms ; food & drink should be avoided for @ least 2 hours before going to bed.  Please do not use Q-tips as we discussed. Should wax build up occur, please put 2-3 drops of mineral oil in the affected  ear at night to soften the wax .Cover the canal with a  cotton ball to prevent the oil from staining bed linens. In the morning fill the ear canal with hydrogen peroxide & lie in the opposite lateral decubitus position(on the side opposite the affected ear)  for 10-15 minutes. After allowing this period of time for the peroxide to dissolve the wax ;shower and use the thinnest washrag available to wick out the wax. If both ears are involved ; alternate this treatment from ear to ear each night until no wax is found on the washrag. Minimal Blood Pressure Goal= AVERAGE < 140/90;  Ideal is an AVERAGE < 135/85. This AVERAGE should be calculated from @ least 5-7 BP readings taken @ different times of day on different days of week. You should not respond to isolated BP readings , but rather  the AVERAGE for that week .Please bring your  blood pressure cuff to office visits to verify that it is reliable.It  can also be checked against the blood pressure device at the pharmacy. Finger or wrist cuffs are not dependable; an arm cuff is. Plain Mucinex (NOT D) for thick secretions ;force NON dairy fluids .   Nasal cleansing in the shower as discussed with lather of mild shampoo.After 10 seconds wash off lather while  exhaling through nostrils. Make sure that all residual soap is removed to prevent irritation.  Flonase OR Nasacort AQ 1 spray in each nostril twice a day as needed. Use the "crossover" technique into opposite nostril spraying toward opposite ear @ 45 degree angle, not straight up into nostril.  Use a Neti pot daily only  as needed for significant sinus congestion; going from open side to congested side . Plain Allegra (NOT D )  160 daily , Loratidine 10 mg , OR Zyrtec 10 mg @ bedtime  as needed for itchy eyes & sneezing.

## 2013-05-25 NOTE — Assessment & Plan Note (Signed)
Blood pressure goals reviewed. BMET 

## 2013-05-25 NOTE — Assessment & Plan Note (Addendum)
Glucosamine risk discussed Lipids, LFTs

## 2013-05-25 NOTE — Progress Notes (Signed)
Subjective:    Patient ID: Daniel Heath, male    DOB: [birth year], 68 y.o.   MRN: [id number]  HPI  He  is here to assess active health issues & conditions. PMH, FH, & Social history verified & updated.Some GERD symptoms X 2 months.  A heart healthy diet is not followed; exercise encompasses 40 minutes >5 times per week as walking without symptoms.  BP not monitored @ home; averages < 135/90 @ pharmacy.  Family history is negative for premature coronary disease. Advanced cholesterol testing reveals  LDL goal is less than 100; ideally < 70 . There is medication compliance with the statin.  Low dose ASA taken  Review of Systems Specifically denied are  chest pain, palpitations, dyspnea, or claudication.  Significant abdominal symptoms, memory deficit, or myalgias not present. Some postural lightheadedness. He is on Glucosamine/chondroitin supplement. Some reflux in am ; Zantac OTC helps. No unexplained weight loss, abdominal pain, dysphagia, melena, rectal bleeding, or persistently small caliber stools. Colonoscopy UTD. No PMH of upper endoscopy. Recurrent wax impactions an issue        Objective:   Physical Exam Gen.: Healthy and well-nourished in appearance. Alert, appropriate and cooperative throughout exam. Head: Normocephalic without obvious abnormalities;  pattern alopecia  Eyes: No corneal or conjunctival inflammation noted. Pupils equal round reactive to light and accommodation. Extraocular motion intact.Pterygium OS Ears: External  ear exam reveals no significant lesions or deformities. Canals clear .TMs normal.  Nose: External nasal exam reveals no deformity or inflammation. Nasal mucosa are pink and moist. No lesions or exudates noted.   Mouth: Oral mucosa and oropharynx reveal no lesions or exudates. Teeth in good repair. Neck: No deformities, masses, or tenderness noted. Range of motion & Thyroid normal. Lungs: Normal respiratory effort; chest expands symmetrically.  Lungs are clear to auscultation without rales, wheezes, or increased work of breathing. Heart: Normal rate and rhythm. Normal S1 and S2. No gallop, click, or rub. S4 w/o murmur. Abdomen: Bowel sounds normal; abdomen soft and nontender. No masses, organomegaly or hernias noted. Genitalia: Genitalia normal except for left varices. Prostate is asymmetric; R lobe > L.No nodularity, or induration                            Musculoskeletal/extremities: No deformity or scoliosis noted of  the thoracic or lumbar spine.   No clubbing, cyanosis, edema, or significant extremity  deformity noted. Range of motion normal .Tone & strength normal. Hand joints normal   Fingernail health good. Able to lie down & sit up w/o help. Negative SLR bilaterally Vascular: Carotid, radial artery, dorsalis pedis and  posterior tibial pulses are full and equal. No bruits present. Neurologic: Alert and oriented x3. Deep tendon reflexes symmetrical and normal.  Gait normal . Skin: Intact without suspicious lesions or rashes. Lymph: No cervical, axillary, or inguinal lymphadenopathy present. Psych: Mood and affect are normal. Normally interactive                                                                                        Assessment & Plan:  #1 comprehensive  exam;  no acute findings except prostatic asymmetry. #2 Problem List/Diagnoses reviewed Plan:  Assessments made/ Orders entered

## 2013-06-01 ENCOUNTER — Telehealth: Payer: Self-pay

## 2013-06-01 ENCOUNTER — Ambulatory Visit: Payer: Medicare Other

## 2013-06-01 DIAGNOSIS — R7309 Other abnormal glucose: Secondary | ICD-10-CM

## 2013-06-01 LAB — HEMOGLOBIN A1C: HEMOGLOBIN A1C: 6 % (ref 4.6–6.5)

## 2013-06-01 NOTE — Telephone Encounter (Signed)
Message copied by Shelly Coss on Mon Jun 01, 2013  9:58 AM ------      Message from: Hendricks Limes      Created: Sat May 30, 2013  3:49 PM       Please add A1c (790.29). Thank you, Hopp       ------

## 2013-06-01 NOTE — Telephone Encounter (Signed)
Add on lab sheet faxed

## 2013-06-02 ENCOUNTER — Other Ambulatory Visit: Payer: Self-pay

## 2013-06-02 MED ORDER — AMLODIPINE BESYLATE 5 MG PO TABS: ORAL_TABLET | ORAL | Status: DC

## 2013-06-02 MED ORDER — LOSARTAN POTASSIUM 100 MG PO TABS: ORAL_TABLET | ORAL | Status: DC

## 2013-06-02 MED ORDER — ATORVASTATIN CALCIUM 20 MG PO TABS: ORAL_TABLET | ORAL | Status: DC

## 2013-07-22 ENCOUNTER — Other Ambulatory Visit: Payer: Self-pay

## 2013-07-22 MED ORDER — CELECOXIB 200 MG PO CAPS: 200.0000 mg | ORAL_CAPSULE | Freq: Two times a day (BID) | ORAL | Status: DC | PRN

## 2013-07-22 NOTE — Telephone Encounter (Signed)
rx refill request for celebrex 200 mg 1 tab po 1-2 times per day prn qty 60x 6 rf

## 2013-11-24 ENCOUNTER — Ambulatory Visit: Payer: Medicare Other

## 2013-11-24 ENCOUNTER — Other Ambulatory Visit: Payer: Self-pay | Admitting: Internal Medicine

## 2013-11-24 DIAGNOSIS — Z23 Encounter for immunization: Secondary | ICD-10-CM | POA: Diagnosis not present

## 2014-03-31 ENCOUNTER — Emergency Department (HOSPITAL_COMMUNITY)
Admission: EM | Admit: 2014-03-31 | Discharge: 2014-03-31 | Disposition: A | Discharge status: Home / Self Care | Payer: Medicare Other | Attending: Emergency Medicine | Admitting: Emergency Medicine

## 2014-03-31 ENCOUNTER — Encounter (HOSPITAL_COMMUNITY): Payer: Self-pay | Admitting: Emergency Medicine

## 2014-03-31 ENCOUNTER — Telehealth: Payer: Self-pay | Admitting: *Deleted

## 2014-03-31 ENCOUNTER — Ambulatory Visit: Payer: Self-pay | Admitting: Family

## 2014-03-31 ENCOUNTER — Emergency Department (HOSPITAL_COMMUNITY): Payer: Medicare Other

## 2014-03-31 ENCOUNTER — Telehealth: Payer: Self-pay | Admitting: Internal Medicine

## 2014-03-31 DIAGNOSIS — M1990 Unspecified osteoarthritis, unspecified site: Secondary | ICD-10-CM | POA: Insufficient documentation

## 2014-03-31 DIAGNOSIS — Z87891 Personal history of nicotine dependence: Secondary | ICD-10-CM | POA: Insufficient documentation

## 2014-03-31 DIAGNOSIS — E785 Hyperlipidemia, unspecified: Secondary | ICD-10-CM | POA: Diagnosis not present

## 2014-03-31 DIAGNOSIS — Z8709 Personal history of other diseases of the respiratory system: Secondary | ICD-10-CM | POA: Insufficient documentation

## 2014-03-31 DIAGNOSIS — Z7951 Long term (current) use of inhaled steroids: Secondary | ICD-10-CM | POA: Insufficient documentation

## 2014-03-31 DIAGNOSIS — R200 Anesthesia of skin: Secondary | ICD-10-CM

## 2014-03-31 DIAGNOSIS — I10 Essential (primary) hypertension: Secondary | ICD-10-CM | POA: Diagnosis not present

## 2014-03-31 DIAGNOSIS — R55 Syncope and collapse: Secondary | ICD-10-CM | POA: Diagnosis not present

## 2014-03-31 DIAGNOSIS — Z7982 Long term (current) use of aspirin: Secondary | ICD-10-CM | POA: Insufficient documentation

## 2014-03-31 DIAGNOSIS — R202 Paresthesia of skin: Secondary | ICD-10-CM | POA: Insufficient documentation

## 2014-03-31 DIAGNOSIS — Z791 Long term (current) use of non-steroidal anti-inflammatories (NSAID): Secondary | ICD-10-CM | POA: Insufficient documentation

## 2014-03-31 DIAGNOSIS — Z79899 Other long term (current) drug therapy: Secondary | ICD-10-CM | POA: Insufficient documentation

## 2014-03-31 DIAGNOSIS — M5022 Other cervical disc displacement, mid-cervical region: Secondary | ICD-10-CM | POA: Diagnosis not present

## 2014-03-31 DIAGNOSIS — M47812 Spondylosis without myelopathy or radiculopathy, cervical region: Secondary | ICD-10-CM | POA: Diagnosis not present

## 2014-03-31 DIAGNOSIS — R42 Dizziness and giddiness: Secondary | ICD-10-CM | POA: Diagnosis not present

## 2014-03-31 LAB — PROTIME-INR
INR: 0.98 (ref 0.00–1.49)
Prothrombin Time: 13.1 seconds (ref 11.6–15.2)

## 2014-03-31 LAB — I-STAT TROPONIN, ED: Troponin i, poc: 0 ng/mL (ref 0.00–0.08)

## 2014-03-31 LAB — HEPATIC FUNCTION PANEL
ALBUMIN: 4.6 g/dL (ref 3.5–5.2)
ALT: 26 U/L (ref 0–53)
AST: 36 U/L (ref 0–37)
Alkaline Phosphatase: 81 U/L (ref 39–117)
BILIRUBIN TOTAL: 1 mg/dL (ref 0.3–1.2)
Bilirubin, Direct: 0.3 mg/dL (ref 0.0–0.5)
Indirect Bilirubin: 0.7 mg/dL (ref 0.3–0.9)
Total Protein: 7.7 g/dL (ref 6.0–8.3)

## 2014-03-31 LAB — BASIC METABOLIC PANEL
ANION GAP: 7 (ref 5–15)
BUN: 22 mg/dL (ref 6–23)
CO2: 29 mmol/L (ref 19–32)
Calcium: 9.8 mg/dL (ref 8.4–10.5)
Chloride: 104 mmol/L (ref 96–112)
Creatinine, Ser: 0.84 mg/dL (ref 0.50–1.35)
GFR, EST NON AFRICAN AMERICAN: 87 mL/min — AB (ref >90)
GLUCOSE: 108 mg/dL — AB (ref 70–99)
POTASSIUM: 4.7 mmol/L (ref 3.5–5.1)
SODIUM: 140 mmol/L (ref 135–145)

## 2014-03-31 LAB — CBC
HCT: 50.3 % (ref 39.0–52.0)
Hemoglobin: 17.2 g/dL — ABNORMAL HIGH (ref 13.0–17.0)
MCH: 32.4 pg (ref 26.0–34.0)
MCHC: 34.2 g/dL (ref 30.0–36.0)
MCV: 94.7 fL (ref 78.0–100.0)
PLATELETS: 195 10*3/uL (ref 150–400)
RBC: 5.31 MIL/uL (ref 4.22–5.81)
RDW: 12.5 % (ref 11.5–15.5)
WBC: 5.8 10*3/uL (ref 4.0–10.5)

## 2014-03-31 LAB — APTT: APTT: 38 s — AB (ref 24–37)

## 2014-03-31 LAB — CBG MONITORING, ED: Glucose-Capillary: 103 mg/dL — ABNORMAL HIGH (ref 70–99)

## 2014-03-31 NOTE — Consult Note (Signed)
Neurology Consultation Reason for Consult: Left leg paresthesia Referring Physician: Sabra Heck, B  CC: Left Leg Paresthesia  History is obtained from:Patient  HPI: Daniel Heath is a 69 y.o. male with a history of hypertension and hyperlipidemia who presents with persistent left leg paresthesia that started earlier today. 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 He states that this happens intermittently to him, and it passes if he drinks some water. This was similar to previous events, however, after it was over he noticed that his left leg was tingling. He states that he also recently experienced numbness of his left arm last wee.   He has had no changes in his gait, no problems with neck pain, no problems with lower back pain, no changes in bowels or bladder.    LKW: 10:30 am tpa given?: no, mild symptoms   ROS: A 14 point ROS was performed and is negative except as noted in the HPI.   Past Medical History  Diagnosis Date  . Hyperlipidemia   . Hypertension   . Arthritis   . Perennial allergic rhinitis    Family History: No history of TIA or stroke  Social History: Tob: denies  Exam: Current vital signs: BP 149/84 mmHg  Pulse 67  Temp(Src) 98.7 F (37.1 C) (Oral)  Resp 12  SpO2 98% Vital signs in last 24 hours: Temp:  [98.7 F (37.1 C)] 98.7 F (37.1 C) (02/10 1212) Pulse Rate:  [67-81] 67 (02/10 1428) Resp:  [12-18] 12 (02/10 1428) BP: (146-149)/(84) 149/84 mmHg (02/10 1428) SpO2:  [98 %-100 %] 98 % (02/10 1428)  Physical Exam  Constitutional: Appears well-developed and well-nourished.  Psych: Affect appropriate to situation Eyes: No scleral injection HENT: No OP obstrucion Head: Normocephalic.  Cardiovascular: Normal rate and regular rhythm.  Respiratory: Effort normal  GI: No distension.  Skin: WDI  Neuro: Mental Status: Patient is awake, alert, oriented to person, place, month, year, and situation. Patient is able to  give a clear and coherent history. No signs of aphasia or neglect Cranial Nerves: II: Visual Fields are full. Pupils are equal, round, and reactive to light.   III,IV, VI: EOMI without ptosis or diploplia.  V: Facial sensation is symmetric to temperature VII: Facial movement is symmetric.  VIII: hearing is intact to voice X: Uvula elevates symmetrically XI: Shoulder shrug is symmetric. XII: tongue is midline without atrophy or fasciculations.  Motor: Tone is normal. Bulk is normal. 5/5 strength was present in all four extremities.  Sensory: Sensation is mildly reduced throughout the left leg.  Deep Tendon Reflexes: 2+ and symmetric in the biceps and patellae.  Plantars: Toes are downgoing bilaterally.  Cerebellar: FNF and HKS are intact bilaterally   I have reviewed labs in epic and the results pertinent to this consultation are: BMP - unremarkable  I have reviewed the images obtained: CT head - No acute findigns.   Impression: 69 yo M with left arm and now left leg tingling. With the symptoms on the arm present for 3 days and now left leg symptoms persistent, if due to cerebrovascular disease then I would expect to see stroke on the MRI. Also possible would be cervical spine diease and it would be reasonable to check for cervical pathology, though he is not clearly myelopathic on exam.   Recommendations: 1) MRI brain and C-spine - if positive for stroke, then he will need to be admitted for a stroke evaluation. If negative, then no further testing is  needed at this time though if he were to get worse, then EMG and lower back imaging would be needed.    Roland Rack, MD [facility] [phone]  If 7pm- 7am, please page neurology on call as listed in [place].

## 2014-03-31 NOTE — ED Provider Notes (Signed)
  Physical Exam  BP 150/86 mmHg  Pulse 76  Temp(Src) 98.7 F (37.1 C) (Oral)  Resp 18  SpO2 97%  Physical Exam  ED Course  Procedures  MDM Assumed care of pt from Dr. Sabra Heck in stable condition. Briefly patient is a 69 year old male with nonspecific neuro symptoms. He was seen by neurology and is to have an MRI. Plan is pending MRI.    MRI unremarkable. I spoke with the patient at length and it appears that he has had left arm symptoms quite some time, cervical MRI did demonstrate potential narrowing of the nerve root. This is likely etiology of his symptoms. Per neuro recommendations the patient was discharged home to follow-up with neurology and neurosurgery. Discharged home in stable condition.  Debby Freiberg, MD 03/31/14 [phone]

## 2014-03-31 NOTE — Telephone Encounter (Signed)
PLEASE NOTE: All timestamps contained within this report are represented as [place] Standard Time. CONFIDENTIALTY NOTICE: This fax transmission is intended only for the addressee. It contains information that is legally privileged, confidential or otherwise protected from use or disclosure. If you are not the intended recipient, you are strictly prohibited from reviewing, disclosing, copying using or disseminating any of this information or taking any action in reliance on or regarding this information. If you have received this fax in error, please notify us immediately by telephone so that we can arrange for its return to [place]. Phone: [phone], Toll-Free: [phone], Fax: [phone] Page: 1 of 1 Call Id: [id number] [facility] [place] Patient Name: Daniel Heath DOB: 01/06/1946 Initial Comment Caller states, has been real dizzy today, with a tingling sensation in left arm, He wants an appt today Nurse Assessment Nurse: Ronnald Ramp, RN, Miranda Date/Time (Eastern Time): 03/31/2014 10:56:28 AM Confirm and document reason for call. If symptomatic, describe symptoms. ---Caller states today he felt severe dizziness for about 15-20 min. Also has been having tingling in his left arm for 1 week. Has the patient traveled out of the country within the last 30 days? ---Not Applicable Does the patient require triage? ---Yes Related visit to physician within the last 2 weeks? ---No Does the PT have any chronic conditions? (i.e. diabetes, asthma, etc.) ---Yes List chronic conditions. ---HTN, High Cholesterol Guidelines Guideline Title Affirmed Question Affirmed Notes Dizziness - Lightheadedness [1] MODERATE dizziness (e.g., interferes with normal activities) AND [2] has NOT been evaluated by physician for this (Exception: dizziness caused by heat exposure, sudden standing, or poor fluid intake) Final Disposition User See  Physician within 24 Hours Ronnald Ramp, RN, Miranda Comments Appt made for 3pm with Dr. Elna Breslow.

## 2014-03-31 NOTE — Discharge Instructions (Signed)
Near-Syncope Near-syncope (commonly known as near fainting) is sudden weakness, dizziness, or feeling like you might pass out. During an episode of near-syncope, you may also develop pale skin, have tunnel vision, or feel sick to your stomach (nauseous). Near-syncope may occur when getting up after sitting or while standing for a long time. It is caused by a sudden decrease in blood flow to the brain. This decrease can result from various causes or triggers, most of which are not serious. However, because near-syncope can sometimes be a sign of something serious, a medical evaluation is required. The specific cause is often not determined. HOME CARE INSTRUCTIONS  Monitor your condition for any changes. The following actions may help to alleviate any discomfort you are experiencing:  Have someone stay with you until you feel stable.  Lie down right away and prop your feet up if you start feeling like you might faint. Breathe deeply and steadily. Wait until all the symptoms have passed. Most of these episodes last only a few minutes. You may feel tired for several hours.   Drink enough fluids to keep your urine clear or pale yellow.   If you are taking blood pressure or heart medicine, get up slowly when seated or lying down. Take several minutes to sit and then stand. This can reduce dizziness.  Follow up with your health care provider as directed. SEEK IMMEDIATE MEDICAL CARE IF:   You have a severe headache.   You have unusual pain in the chest, abdomen, or back.   You are bleeding from the mouth or rectum, or you have black or tarry stool.   You have an irregular or very fast heartbeat.   You have repeated fainting or have seizure-like jerking during an episode.   You faint when sitting or lying down.   You have confusion.   You have difficulty walking.   You have severe weakness.   You have vision problems.  MAKE SURE YOU:   Understand these instructions.  Will  watch your condition.  Will get help right away if you are not doing well or get worse. Document Released: 02/05/2005 Document Revised: 02/10/2013 Document Reviewed: 07/11/2012 [facility] Patient Information 2015 [place], [place]. This information is not intended to replace advice given to you by your health care provider. Make sure you discuss any questions you have with your health care provider. Cervical Radiculopathy Cervical radiculopathy happens when a nerve in the neck is pinched or bruised by a slipped (herniated) disk or by arthritic changes in the bones of the cervical spine. This can occur due to an injury or as part of the normal aging process. Pressure on the cervical nerves can cause pain or numbness that runs from your neck all the way down into your arm and fingers. CAUSES  There are many possible causes, including:  Injury.  Muscle tightness in the neck from overuse.  Swollen, painful joints (arthritis).  Breakdown or degeneration in the bones and joints of the spine (spondylosis) due to aging.  Bone spurs that may develop near the cervical nerves. SYMPTOMS  Symptoms include pain, weakness, or numbness in the affected arm and hand. Pain can be severe or irritating. Symptoms may be worse when extending or turning the neck. DIAGNOSIS  Your caregiver will ask about your symptoms and do a physical exam. He or she may test your strength and reflexes. X-rays, CT scans, and MRI scans may be needed in cases of injury or if the symptoms do not go away after  a period of time. Electromyography (EMG) or nerve conduction testing may be done to study how your nerves and muscles are working. TREATMENT  Your caregiver may recommend certain exercises to help relieve your symptoms. Cervical radiculopathy can, and often does, get better with time and treatment. If your problems continue, treatment options may include:  Wearing a soft collar for short periods of time.  Physical therapy to  strengthen the neck muscles.  Medicines, such as nonsteroidal anti-inflammatory drugs (NSAIDs), oral corticosteroids, or spinal injections.  Surgery. Different types of surgery may be done depending on the cause of your problems. HOME CARE INSTRUCTIONS   Put ice on the affected area.  Put ice in a plastic bag.  Place a towel between your skin and the bag.  Leave the ice on for 15-20 minutes, 03-04 times a day or as directed by your caregiver.  If ice does not help, you can try using heat. Take a warm shower or bath, or use a hot water bottle as directed by your caregiver.  You may try a gentle neck and shoulder massage.  Use a flat pillow when you sleep.  Only take over-the-counter or prescription medicines for pain, discomfort, or fever as directed by your caregiver.  If physical therapy was prescribed, follow your caregiver's directions.  If a soft collar was prescribed, use it as directed. SEEK IMMEDIATE MEDICAL CARE IF:   Your pain gets much worse and cannot be controlled with medicines.  You have weakness or numbness in your hand, arm, face, or leg.  You have a high fever or a stiff, rigid neck.  You lose bowel or bladder control (incontinence).  You have trouble with walking, balance, or speaking. MAKE SURE YOU:   Understand these instructions.  Will watch your condition.  Will get help right away if you are not doing well or get worse. Document Released: 10/31/2000 Document Revised: 04/30/2011 Document Reviewed: 09/19/2010 [facility] Patient Information 2015 [place], [place]. This information is not intended to replace advice given to you by your health care provider. Make sure you discuss any questions you have with your health care provider.

## 2014-03-31 NOTE — ED Provider Notes (Signed)
CSN: [id number]     Arrival date & time 03/31/14  1203 History   First MD Initiated Contact with Patient 03/31/14 1226     Chief Complaint  Patient presents with  . Dizziness  . Tingling     (Consider location/radiation/quality/duration/timing/severity/associated sxs/prior Treatment) HPI Comments: The patient is a 69 year old male, he has a history of hypertension, hypercholesterolemia, and presents with a complaint of left arm and left leg pins and needles paresthesias. He states this has been intermittent over the last week or so after he went golfing. 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 His legs felt weak bilaterally and his left lower extremity in the thigh and the calf felt like he had paresthesias described as pins and needles like his leg was asleep. This has still not resolved, it is persistent, he has no associated visual changes, difficulty with speech or difficulty with balance. He no longer feels lightheaded and he has had no chest pain shortness of breath fevers chills nausea vomiting or diarrhea.  Patient is a 69 y.o. male presenting with dizziness. The history is provided by the patient.  Dizziness   Past Medical History  Diagnosis Date  . Hyperlipidemia   . Hypertension   . Arthritis   . Perennial allergic rhinitis    Past Surgical History  Procedure Laterality Date  . Tonsillectomy and adenoidectomy  at 69 yrs old  . Colonoscopy  2003 & 2013    diverticulosis; polyp 2013   Family History  Problem Relation Age of Onset  . Cancer Father 70    ?gallbladder,wide spread metastases  . Lung cancer Father     occasional cigar smoker; asbestos exposure  . Colon cancer Neg Hx   . Diabetes Neg Hx   . Heart disease Neg Hx   . Stroke Paternal Grandmother     late 27s   History  Substance Use Topics   . Smoking status: Former Smoker    Quit date: 02/20/1968  . Smokeless tobacco: Never Used     Comment: smoked 1961-1970,up to 1/2 ppd  . Alcohol Use: 3.3 oz/week    3 Glasses of wine, 3 Standard drinks or equivalent per week     Comment: Daily wine or liquor drink per patient.    Review of Systems  Neurological: Positive for dizziness.  All other systems reviewed and are negative.     Allergies  Benazepril hcl  Home Medications   Prior to Admission medications   Medication Sig Start Date End Date Taking? Authorizing Provider  acetaminophen ([employer] ARTHRITIS PAIN RELIEF) 650 MG CR tablet Take 650 mg by mouth every 8 (eight) hours as needed.    Historical Provider, MD  amLODipine (NORVASC) 5 MG tablet TAKE 1 TABLET BY MOUTH EVERY DAY 11/24/13   Hendricks Limes, MD  aspirin 81 MG tablet Take 81 mg by mouth daily.      Historical Provider, MD  atorvastatin (LIPITOR) 20 MG tablet TAKE 1 TABLET (20 MG TOTAL) BY MOUTH DAILY. 11/24/13   Hendricks Limes, MD  B Complex-C (SUPER B COMPLEX PO) Take 1 capsule by mouth daily.    Historical Provider, MD  celecoxib (CELEBREX) 200 MG capsule Take 1 capsule (200 mg total) by mouth 2 (two) times daily as needed. 07/22/13   Hendricks Limes, MD  Cholecalciferol (VITAMIN D3)  1000 UNITS CAPS Take 1 capsule by mouth daily.    Historical Provider, MD  Coenzyme Q10 (COQ10) 200 MG CAPS Take by mouth daily.    Historical Provider, MD  Flaxseed, Linseed, (FLAX SEEDS PO) Take by mouth daily.    Historical Provider, MD  fluticasone (FLONASE) 50 MCG/ACT nasal spray Place 2 sprays into the nose as directed. 1 spray in each nostril twice a day as needed. Use the "crossover" technique as discussed  12/04/10   Hendricks Limes, MD  glucosamine-chondroitin 500-400 MG tablet Take 1 tablet by mouth 3 (three) times daily.    Historical Provider, MD  loratadine (CLARITIN) 10 MG tablet Take 10 mg by mouth daily.    Historical Provider, MD  losartan (COZAAR) 100 MG tablet  TAKE 1 TABLET BY MOUTH EVERY DAY 11/24/13   Hendricks Limes, MD  Omega-3 Fatty Acids (FISH OIL) 1200 MG CAPS Take 2 capsules by mouth daily.    Historical Provider, MD  Probiotic Product (ALIGN) 4 MG CAPS Take by mouth daily.    Historical Provider, MD  Saw Palmetto, Serenoa repens, (SAW PALMETTO PO) Take by mouth daily.      Historical Provider, MD  sildenafil (VIAGRA) 100 MG tablet Take 100 mg by mouth daily as needed.      Historical Provider, MD  Turmeric Curcumin 500 MG CAPS Take by mouth. 1 by mouth twice daily    Historical Provider, MD   BP 146/84 mmHg  Pulse 81  Temp(Src) 98.7 F (37.1 C) (Oral)  Resp 18  SpO2 100% Physical Exam  Constitutional: He appears well-developed and well-nourished. No distress.  HENT:  Head: Normocephalic and atraumatic.  Mouth/Throat: Oropharynx is clear and moist. No oropharyngeal exudate.  Eyes: Conjunctivae and EOM are normal. Pupils are equal, round, and reactive to light. Right eye exhibits no discharge. Left eye exhibits no discharge. No scleral icterus.  Neck: Normal range of motion. Neck supple. No JVD present. No thyromegaly present.  Cardiovascular: Normal rate, regular rhythm, normal heart sounds and intact distal pulses.  Exam reveals no gallop and no friction rub.   No murmur heard. No carotid bruit  Pulmonary/Chest: Effort normal and breath sounds normal. No respiratory distress. He has no wheezes. He has no rales.  Abdominal: Soft. Bowel sounds are normal. He exhibits no distension and no mass. There is no tenderness.  Musculoskeletal: Normal range of motion. He exhibits no edema or tenderness.  Lymphadenopathy:    He has no cervical adenopathy.  Neurological: He is alert. Coordination normal.  Speech is clear, cranial nerves III through XII are intact, memory is intact, strength is normal in all 4 extremities including grips, sensation is intact to light touch and pinprick in all 4 extremities. Coordination as tested by  finger-nose-finger is normal, no limb ataxia. Normal gait, normal reflexes at the patellar tendons bilaterally  Skin: Skin is warm and dry. No rash noted. No erythema.  Psychiatric: He has a normal mood and affect. His behavior is normal.  Nursing note and vitals reviewed.   ED Course  Procedures (including critical care time) Labs Review Labs Reviewed  CBC - Abnormal; Notable for the following:    Hemoglobin 17.2 (*)    All other components within normal limits  CBG MONITORING, ED - Abnormal; Notable for the following:    Glucose-Capillary 103 (*)    All other components within normal limits  BASIC METABOLIC PANEL  HEPATIC FUNCTION PANEL  PROTIME-INR  APTT  I-STAT TROPOININ, ED  Imaging Review No results found.   EKG Interpretation   Date/Time:  Wednesday March 31 2014 12:09:26 EST Ventricular Rate:  74 PR Interval:  183 QRS Duration: 108 QT Interval:  383 QTC Calculation: 425 R Axis:   -44 Text Interpretation:  Sinus rhythm Inferior infarct, old Anterior infarct,  old Baseline wander in lead(s) V1 Abnormal ekg No old tracing to compare  Confirmed by Leanora Murin  MD, [place] ([id number]) on 03/31/2014 1:46:42 PM      MDM   Final diagnoses:  Numbness    The patient has symptoms that could be concerning for transient ischemic attack, at this time the patient has no objective findings including no numbness to light touch or pinprick, normal strength and coordination. He will need neurologic evaluation with CT scan, labs, will discuss with neurology. At this time the patient has a stroke scale of 0  Discussed the patient's care with the neurologist, Dr. Leonel Ramsay will come to see the patient. CT scan of labs unremarkable  Neuro recommends MRI - admit if positive, home with w/u by PMD if neg.  Change of shift - care signed out to Dr. Sharion Settler, MD 03/31/14 [phone]

## 2014-03-31 NOTE — Telephone Encounter (Signed)
[facility] [facility] - Client [facility] Patient Name: Daniel Heath Gender: Male DOB: 18-May-1945 Age: 69 Y 22 M 18 D Return Phone Number: [phone] (Primary), [phone] (Alternate) Address: [address] City/State/Zip: [place] [address] Client [place] [facility] [place] [facility] - Client Client Site [place] - Day Physician [place], [place] Type Call Call Type Triage / Clinical Relationship To Patient Self Appointment Disposition EMR Appointment Scheduled Return Phone Number [phone] (Primary) Chief Complaint Dizziness Initial Comment Caller states, has been real dizzy today, with a tingling sensation in left arm, He wants an appt today PreDisposition Call Doctor Info pasted into Epic Yes Nurse Assessment Nurse: Ronnald Ramp, RN, Miranda Date/Time (Eastern Time): 03/31/2014 10:56:28 AM Confirm and document reason for call. If symptomatic, describe symptoms. ---Caller states today he felt severe dizziness for about 15-20 min. Also has been having tingling in his left arm for 1 week. Has the patient traveled out of the country within the last 30 days? ---Not Applicable Does the patient require triage? ---Yes Related visit to physician within the last 2 weeks? ---No Does the PT have any chronic conditions? (i.e. diabetes, asthma, etc.) ---Yes List chronic conditions. ---HTN, High Cholesterol Guidelines Guideline Title Affirmed Question Affirmed Notes Nurse Date/Time (Eastern Time) Dizziness - Lightheadedness [1] MODERATE dizziness (e.g., interferes with normal activities) AND [2] has NOT been evaluated by physician for this (Exception: dizziness caused by heat exposure, sudden standing, or poor fluid intake) Ronnald Ramp, RN, Miranda 03/31/2014 10:57:52 AM Disp. Time Eilene Ghazi Time) Disposition Final User 03/31/2014 11:01:09 AM See Physician within 24 Hours Yes Ronnald Ramp, RN, Miranda PLEASE NOTE: All  timestamps contained within this report are represented as [place] Standard Time. CONFIDENTIALTY NOTICE: This fax transmission is intended only for the addressee. It contains information that is legally privileged, confidential or otherwise protected from use or disclosure. If you are not the intended recipient, you are strictly prohibited from reviewing, disclosing, copying using or disseminating any of this information or taking any action in reliance on or regarding this information. If you have received this fax in error, please notify us immediately by telephone so that we can arrange for its return to [place]. Phone: [phone], Toll-Free: [phone], Fax: [phone] Page: 2 of 2 Call Id: [id number] Caller Understands: Yes Disagree/Comply: Comply Care Advice Given Per Guideline SEE PHYSICIAN WITHIN 24 HOURS: FLUIDS: Drink several glasses of fruit juice, other clear fluids or water. This will improve hydration and blood glucose. If the weather is hot, make sure the fluids are cold. REST: Lie down with feet elevated for 1 hour. This will improve circulation and increase blood flow to the brain. CALL BACK IF: * Passes out (faints) * You become worse. CARE ADVICE given per Dizziness (Adult) guideline. After Care Instructions Given Call Event Type User Date / Time Description Comments User: Leverne Humbles, RN Date/Time Eilene Ghazi Time): 03/31/2014 11:02:48 AM Appt made for 3pm with Dr. Elna Breslow. Referrals REFERRED TO PCP OFFICE

## 2014-03-31 NOTE — ED Notes (Signed)
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  Pt sat down and feeling passed however, then started to have lt leg tingling.  No slurred speech, ambulatory with steady gait, symmetrical face, equal grips.

## 2014-05-28 ENCOUNTER — Other Ambulatory Visit: Payer: Self-pay | Admitting: Internal Medicine

## 2014-08-20 ENCOUNTER — Other Ambulatory Visit: Payer: Self-pay | Admitting: Internal Medicine

## 2014-08-20 NOTE — Telephone Encounter (Signed)
Amlodipine, lipitor and cozaar rx sent to pharm

## 2014-10-04 ENCOUNTER — Telehealth: Payer: Self-pay | Admitting: Emergency Medicine

## 2014-10-04 MED ORDER — CELECOXIB 200 MG PO CAPS: 200.0000 mg | ORAL_CAPSULE | Freq: Two times a day (BID) | ORAL | Status: DC | PRN

## 2014-10-04 NOTE — Telephone Encounter (Signed)
Refill request for Celecoxib. Last OV 4/15. Please advise

## 2014-10-04 NOTE — Telephone Encounter (Signed)
1 month supply provided. Check with Hopp as he most likely needs a new office visit.

## 2014-12-23 ENCOUNTER — Encounter: Payer: Self-pay | Admitting: Internal Medicine

## 2014-12-23 ENCOUNTER — Ambulatory Visit (INDEPENDENT_AMBULATORY_CARE_PROVIDER_SITE_OTHER): Payer: Medicare Other | Admitting: Internal Medicine

## 2014-12-23 VITALS — BP 130/88 | HR 72 | Temp 97.6°F | Ht 70.0 in | Wt 188.0 lb

## 2014-12-23 DIAGNOSIS — I10 Essential (primary) hypertension: Secondary | ICD-10-CM | POA: Diagnosis not present

## 2014-12-23 DIAGNOSIS — D126 Benign neoplasm of colon, unspecified: Secondary | ICD-10-CM | POA: Diagnosis not present

## 2014-12-23 DIAGNOSIS — N429 Disorder of prostate, unspecified: Secondary | ICD-10-CM | POA: Diagnosis not present

## 2014-12-23 DIAGNOSIS — Z23 Encounter for immunization: Secondary | ICD-10-CM

## 2014-12-23 DIAGNOSIS — E785 Hyperlipidemia, unspecified: Secondary | ICD-10-CM

## 2014-12-23 DIAGNOSIS — K635 Polyp of colon: Secondary | ICD-10-CM

## 2014-12-23 NOTE — Assessment & Plan Note (Signed)
Lipids, LFTs, TSH  

## 2014-12-23 NOTE — Assessment & Plan Note (Deleted)
CBC

## 2014-12-23 NOTE — Progress Notes (Signed)
   Subjective:    Patient ID: Daniel Heath, male    DOB: January 09, 1946, 69 y.o.   MRN: [id number]  HPI The patient is here to assess status of active health conditions.  PMH, FH, & Social History reviewed & updated.No change in [place] as recorded.   He does not add salt at the table. He does eat some fried foods and red meat. He will walk 6-10 miles per week without associated cardiopulmonary symptoms.   Colonoscopy is UTD. He has no GI symptoms.  He's been compliant with his medicines without adverse effects. He does not check his blood pressure   He's been having some pain in the right ankle after standing for periods of time. This is benefited by wearing an ankle wrap.   He is recovering from poison oak over the right upper extremity.This has ben treated with Chlorox and Calamine topically.  Chronically he has nocturia twice nightly.  Review of Systems  Chest pain, palpitations, tachycardia, exertional dyspnea, paroxysmal nocturnal dyspnea, claudication or edema are absent. No unexplained weight loss, abdominal pain, significant dyspepsia, dysphagia, melena, rectal bleeding, or persistently small caliber stools. Dysuria, pyuria, hematuria, frequency, or polyuria are denied. Change in hair, skin, nails denied. No bowel changes of constipation or diarrhea. No intolerance to heat or cold.     Objective:   Physical Exam  Pertinent or positive findings include: pattern alopecia is present. He has a Engineering geologist. 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 He has minor knee crepitus. He has dermatitis of the right forearm which  Is predominantly healing eschar formation. 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 Right prostate is mildly enlarged without nodularity or induration.   General appearance :adequately nourished; in no distress.  Eyes: No conjunctival inflammation or scleral icterus is present.  Oral exam:  Lips and gums are healthy appearing.There is no oropharyngeal erythema or  exudate noted. Dental hygiene is good.  Heart:  Normal rate and regular rhythm. S1 and S2 normal without gallop, murmur, click, rub or other extra sounds    Lungs:Chest clear to auscultation; no wheezes, rhonchi,rales ,or rubs present.No increased work of breathing.   Abdomen: bowel sounds normal, soft and non-tender without masses, organomegaly or hernias noted.  No guarding or rebound.  Vascular : all pulses equal ; no bruits present.  Skin:Warm & dry.  Intact without suspicious lesions or rashes ; no tenting or jaundice   Lymphatic: No lymphadenopathy is noted about the head, neck, axilla, or inguinal areas.   Neuro: Strength, tone & DTRs normal.      Assessment & Plan:  #1See Current Assessment & Plan in Problem List under specific Diagnosis    #2 asymmetric prostatic hypertrophy right lobe

## 2014-12-23 NOTE — Assessment & Plan Note (Signed)
Blood pressure goals reviewed. BMET 

## 2014-12-23 NOTE — Patient Instructions (Signed)
Minimal Blood Pressure Goal= AVERAGE < 140/90;  Ideal is an AVERAGE < 135/85. This AVERAGE should be calculated from @ least 5-7 BP readings taken @ different times of day on different days of week. You should not respond to isolated BP readings , but rather the AVERAGE for that week .Please bring your  blood pressure cuff to office visits to verify that it is reliable.It  can also be checked against the blood pressure device at the pharmacy. Finger or wrist cuffs are not dependable; an arm cuff is. Your next office appointment will be determined based upon review of your pending labs. Those written interpretation of the lab results and instructions will be transmitted to you by mail for your records.  Critical results will be called.   Followup as needed for any active or acute issue. Please report any significant change in your symptoms. 

## 2014-12-23 NOTE — Progress Notes (Signed)
Pre visit review using our clinic review tool, if applicable. No additional management support is needed unless otherwise documented below in the visit note. 

## 2014-12-23 NOTE — Assessment & Plan Note (Signed)
CBC

## 2014-12-24 ENCOUNTER — Encounter: Payer: Self-pay | Admitting: Internal Medicine

## 2014-12-27 ENCOUNTER — Ambulatory Visit (INDEPENDENT_AMBULATORY_CARE_PROVIDER_SITE_OTHER): Payer: Medicare Other

## 2014-12-27 ENCOUNTER — Telehealth: Payer: Self-pay

## 2014-12-27 ENCOUNTER — Other Ambulatory Visit (INDEPENDENT_AMBULATORY_CARE_PROVIDER_SITE_OTHER): Payer: Medicare Other

## 2014-12-27 ENCOUNTER — Other Ambulatory Visit: Payer: Self-pay | Admitting: Internal Medicine

## 2014-12-27 DIAGNOSIS — N429 Disorder of prostate, unspecified: Secondary | ICD-10-CM

## 2014-12-27 DIAGNOSIS — Z23 Encounter for immunization: Secondary | ICD-10-CM | POA: Diagnosis not present

## 2014-12-27 DIAGNOSIS — Z1159 Encounter for screening for other viral diseases: Secondary | ICD-10-CM

## 2014-12-27 DIAGNOSIS — E785 Hyperlipidemia, unspecified: Secondary | ICD-10-CM

## 2014-12-27 DIAGNOSIS — D126 Benign neoplasm of colon, unspecified: Secondary | ICD-10-CM | POA: Diagnosis not present

## 2014-12-27 DIAGNOSIS — I10 Essential (primary) hypertension: Secondary | ICD-10-CM | POA: Diagnosis not present

## 2014-12-27 LAB — BASIC METABOLIC PANEL
BUN: 19 mg/dL (ref 6–23)
CALCIUM: 10 mg/dL (ref 8.4–10.5)
CO2: 31 mEq/L (ref 19–32)
Chloride: 102 mEq/L (ref 96–112)
Creatinine, Ser: 0.92 mg/dL (ref 0.40–1.50)
GFR: 86.42 mL/min (ref >60.00)
Glucose, Bld: 108 mg/dL — ABNORMAL HIGH (ref 70–99)
Potassium: 4.6 mEq/L (ref 3.5–5.1)
Sodium: 141 mEq/L (ref 135–145)

## 2014-12-27 LAB — CBC WITH DIFFERENTIAL/PLATELET
Basophils Absolute: 0 10*3/uL (ref 0.0–0.1)
Basophils Relative: 0.5 % (ref 0.0–3.0)
Eosinophils Absolute: 0.2 10*3/uL (ref 0.0–0.7)
Eosinophils Relative: 3.1 % (ref 0.0–5.0)
HCT: 49.9 % (ref 39.0–52.0)
HEMOGLOBIN: 16.7 g/dL (ref 13.0–17.0)
LYMPHS ABS: 1 10*3/uL (ref 0.7–4.0)
Lymphocytes Relative: 19.9 % (ref 12.0–46.0)
MCHC: 33.4 g/dL (ref 30.0–36.0)
MCV: 95.3 fl (ref 78.0–100.0)
MONO ABS: 0.4 10*3/uL (ref 0.1–1.0)
Monocytes Relative: 8.9 % (ref 3.0–12.0)
Neutro Abs: 3.4 10*3/uL (ref 1.4–7.7)
Neutrophils Relative %: 67.6 % (ref 43.0–77.0)
Platelets: 201 10*3/uL (ref 150.0–400.0)
RBC: 5.24 Mil/uL (ref 4.22–5.81)
RDW: 12.6 % (ref 11.5–15.5)
WBC: 5.1 10*3/uL (ref 4.0–10.5)

## 2014-12-27 LAB — LIPID PANEL
Cholesterol: 173 mg/dL (ref 0–200)
HDL: 43.6 mg/dL (ref >39.00)
LDL CALC: 117 mg/dL — AB (ref 0–99)
NONHDL: 129.62
Total CHOL/HDL Ratio: 4
Triglycerides: 65 mg/dL (ref 0.0–149.0)
VLDL: 13 mg/dL (ref 0.0–40.0)

## 2014-12-27 LAB — HEPATIC FUNCTION PANEL
ALK PHOS: 79 U/L (ref 39–117)
ALT: 20 U/L (ref 0–53)
AST: 20 U/L (ref 0–37)
Albumin: 4 g/dL (ref 3.5–5.2)
BILIRUBIN TOTAL: 0.7 mg/dL (ref 0.2–1.2)
Bilirubin, Direct: 0.1 mg/dL (ref 0.0–0.3)
Total Protein: 6.5 g/dL (ref 6.0–8.3)

## 2014-12-27 LAB — TSH: TSH: 2.75 u[IU]/mL (ref 0.35–4.50)

## 2014-12-27 LAB — PSA: PSA: 1.88 ng/mL (ref 0.10–4.00)

## 2014-12-27 NOTE — Telephone Encounter (Signed)
Patient is coming back in to office on Thursday 11/10 to get tetanus vaccine, would also like for you to place order to have Hep C drawn at lab----please advise, thanks

## 2014-12-29 ENCOUNTER — Other Ambulatory Visit (INDEPENDENT_AMBULATORY_CARE_PROVIDER_SITE_OTHER): Payer: Medicare Other

## 2014-12-29 DIAGNOSIS — R739 Hyperglycemia, unspecified: Secondary | ICD-10-CM | POA: Diagnosis not present

## 2014-12-29 LAB — HEMOGLOBIN A1C: HEMOGLOBIN A1C: 5.9 % (ref 4.6–6.5)

## 2014-12-30 ENCOUNTER — Ambulatory Visit (INDEPENDENT_AMBULATORY_CARE_PROVIDER_SITE_OTHER): Payer: Medicare Other

## 2014-12-30 ENCOUNTER — Other Ambulatory Visit: Payer: Medicare Other

## 2014-12-30 DIAGNOSIS — Z1159 Encounter for screening for other viral diseases: Secondary | ICD-10-CM | POA: Diagnosis not present

## 2014-12-30 DIAGNOSIS — Z23 Encounter for immunization: Secondary | ICD-10-CM

## 2014-12-31 LAB — HEPATITIS C ANTIBODY: HCV AB: NEGATIVE

## 2015-01-07 ENCOUNTER — Encounter: Payer: Self-pay | Admitting: Gastroenterology

## 2015-02-03 ENCOUNTER — Other Ambulatory Visit: Payer: Self-pay | Admitting: Internal Medicine

## 2015-03-01 ENCOUNTER — Other Ambulatory Visit: Payer: Self-pay | Admitting: Internal Medicine

## 2015-03-01 NOTE — Telephone Encounter (Signed)
Left message advising patient to call back and let us know which PCP he is wanting to get established with, Rx refill request for celebrex cannot be sent until appt is made for new PCP---patient needs to schedule appt with new PCP and then let tamara know so that we can send a partial refill in to pharm

## 2015-04-21 DIAGNOSIS — J302 Other seasonal allergic rhinitis: Secondary | ICD-10-CM | POA: Diagnosis not present

## 2015-04-21 DIAGNOSIS — Z1389 Encounter for screening for other disorder: Secondary | ICD-10-CM | POA: Diagnosis not present

## 2015-04-21 DIAGNOSIS — Z6828 Body mass index (BMI) 28.0-28.9, adult: Secondary | ICD-10-CM | POA: Diagnosis not present

## 2015-04-21 DIAGNOSIS — E784 Other hyperlipidemia: Secondary | ICD-10-CM | POA: Diagnosis not present

## 2015-04-21 DIAGNOSIS — M1990 Unspecified osteoarthritis, unspecified site: Secondary | ICD-10-CM | POA: Diagnosis not present

## 2015-04-21 DIAGNOSIS — I10 Essential (primary) hypertension: Secondary | ICD-10-CM | POA: Diagnosis not present

## 2015-04-29 NOTE — Telephone Encounter (Signed)
Close encounter since pt has not made appt. Msg was left for him back on 03/01/15. Denied script closing encounter...Daniel Heath

## 2015-08-15 ENCOUNTER — Other Ambulatory Visit: Payer: Self-pay | Admitting: *Deleted

## 2015-08-15 MED ORDER — AMLODIPINE BESYLATE 5 MG PO TABS: 5.0000 mg | ORAL_TABLET | Freq: Every day | ORAL | Status: AC

## 2015-08-15 MED ORDER — LOSARTAN POTASSIUM 100 MG PO TABS: 100.0000 mg | ORAL_TABLET | Freq: Every day | ORAL | Status: DC

## 2015-08-20 ENCOUNTER — Other Ambulatory Visit: Payer: Self-pay | Admitting: Internal Medicine

## 2015-09-19 DIAGNOSIS — D235 Other benign neoplasm of skin of trunk: Secondary | ICD-10-CM | POA: Diagnosis not present

## 2015-09-19 DIAGNOSIS — L918 Other hypertrophic disorders of the skin: Secondary | ICD-10-CM | POA: Diagnosis not present

## 2015-09-19 DIAGNOSIS — L821 Other seborrheic keratosis: Secondary | ICD-10-CM | POA: Diagnosis not present

## 2015-09-19 DIAGNOSIS — D1801 Hemangioma of skin and subcutaneous tissue: Secondary | ICD-10-CM | POA: Diagnosis not present

## 2015-09-19 DIAGNOSIS — L812 Freckles: Secondary | ICD-10-CM | POA: Diagnosis not present

## 2015-10-20 DIAGNOSIS — H524 Presbyopia: Secondary | ICD-10-CM | POA: Diagnosis not present

## 2015-10-20 DIAGNOSIS — H2513 Age-related nuclear cataract, bilateral: Secondary | ICD-10-CM | POA: Diagnosis not present

## 2015-11-23 DIAGNOSIS — Z23 Encounter for immunization: Secondary | ICD-10-CM | POA: Diagnosis not present

## 2016-02-09 ENCOUNTER — Other Ambulatory Visit: Payer: Self-pay | Admitting: Family

## 2016-02-09 NOTE — Addendum Note (Signed)
Addended by: Earnstine Regal on: 02/09/2016 12:40 PM   Modules accepted: Orders

## 2016-03-08 DIAGNOSIS — I10 Essential (primary) hypertension: Secondary | ICD-10-CM | POA: Diagnosis not present

## 2016-03-08 DIAGNOSIS — Z125 Encounter for screening for malignant neoplasm of prostate: Secondary | ICD-10-CM | POA: Diagnosis not present

## 2016-03-08 DIAGNOSIS — E784 Other hyperlipidemia: Secondary | ICD-10-CM | POA: Diagnosis not present

## 2016-03-15 DIAGNOSIS — I10 Essential (primary) hypertension: Secondary | ICD-10-CM | POA: Diagnosis not present

## 2016-03-15 DIAGNOSIS — J302 Other seasonal allergic rhinitis: Secondary | ICD-10-CM | POA: Diagnosis not present

## 2016-03-15 DIAGNOSIS — M1990 Unspecified osteoarthritis, unspecified site: Secondary | ICD-10-CM | POA: Diagnosis not present

## 2016-03-15 DIAGNOSIS — Z1389 Encounter for screening for other disorder: Secondary | ICD-10-CM | POA: Diagnosis not present

## 2016-03-15 DIAGNOSIS — Z6828 Body mass index (BMI) 28.0-28.9, adult: Secondary | ICD-10-CM | POA: Diagnosis not present

## 2016-03-15 DIAGNOSIS — E663 Overweight: Secondary | ICD-10-CM | POA: Diagnosis not present

## 2016-03-15 DIAGNOSIS — E784 Other hyperlipidemia: Secondary | ICD-10-CM | POA: Diagnosis not present

## 2016-03-15 DIAGNOSIS — Z0000 Encounter for general adult medical examination without abnormal findings: Secondary | ICD-10-CM | POA: Diagnosis not present

## 2016-03-26 DIAGNOSIS — Z1212 Encounter for screening for malignant neoplasm of rectum: Secondary | ICD-10-CM | POA: Diagnosis not present

## 2016-06-10 IMAGING — MR MR CERVICAL SPINE W/O CM
4 of 6 series · 19 of 48 positions shown · non-contrast
Comparison: None.

CLINICAL DATA: LEFT leg paresthesias beginning earlier today. LEFT
arm and LEFT leg tingling. Evaluate for cervical pathology. No clear
myelopathic signs and symptoms.

EXAM:
MRI CERVICAL SPINE WITHOUT CONTRAST
TECHNIQUE: Multiplanar, multisequence MR imaging of the cervical spine was
performed. No intravenous contrast was administered.

[Series 6: T1 · sagittal · 3.0mm · 0.41mm/px · 3 of 13 slices shown (1 of 2)]
[im 1/13]
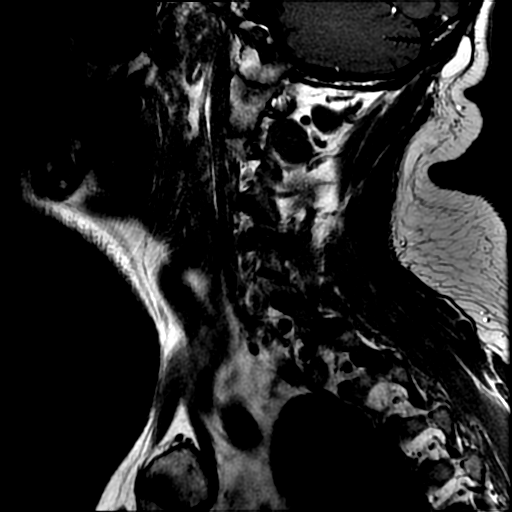
[im 7/13]
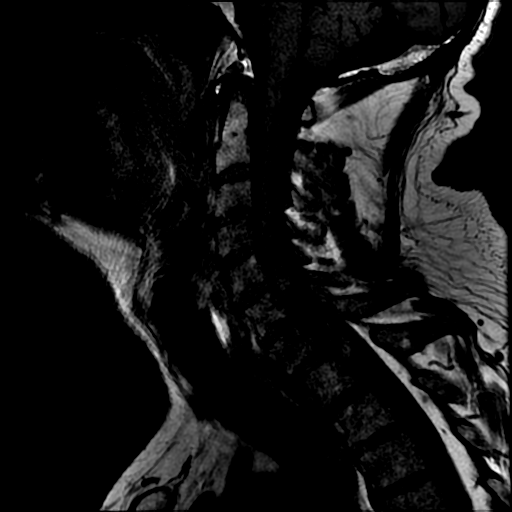
[im 13/13]
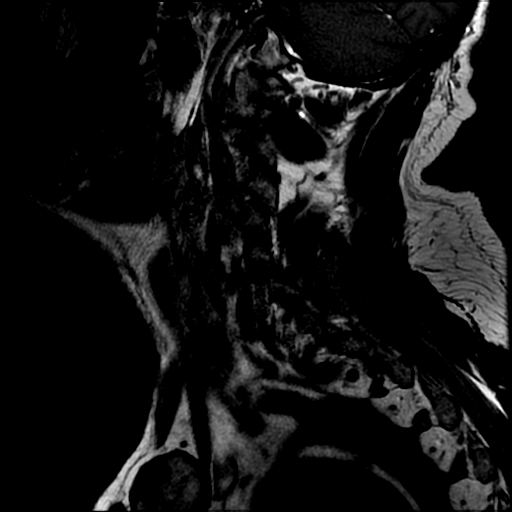

[Series 8: T2 post-contrast · sagittal · 3.0mm · 0.41mm/px · 5 of 13 slices shown]
[im 1/13]
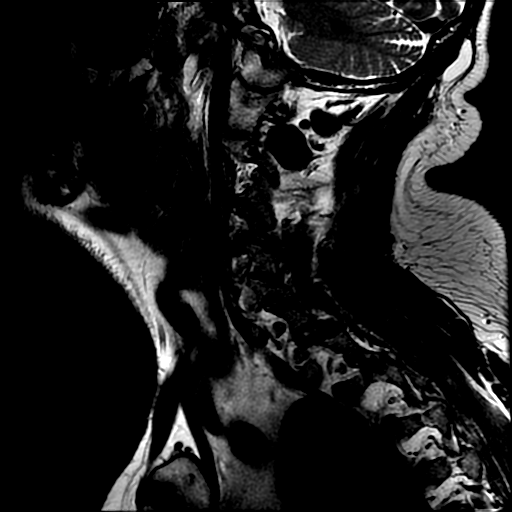
[im 4/13]
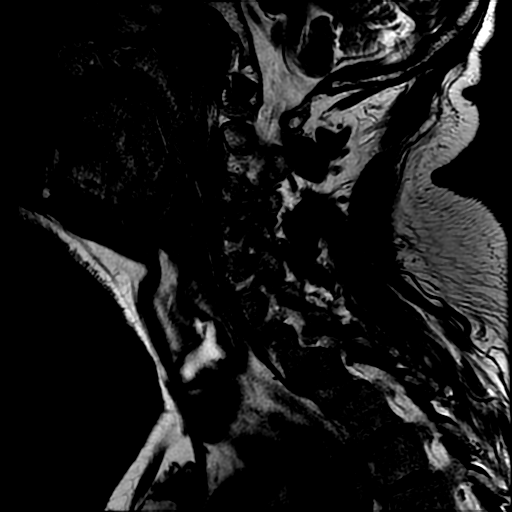
[im 7/13]
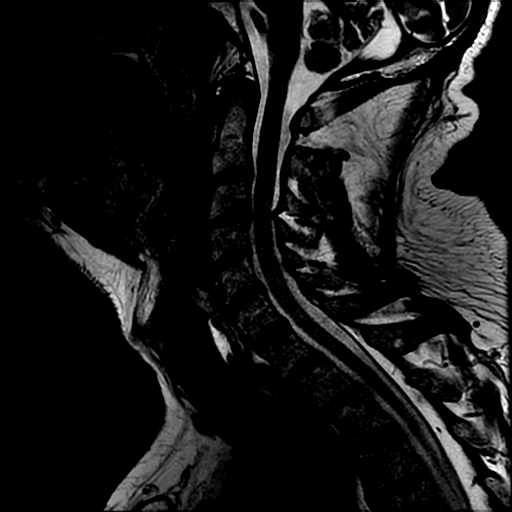
[im 10/13]
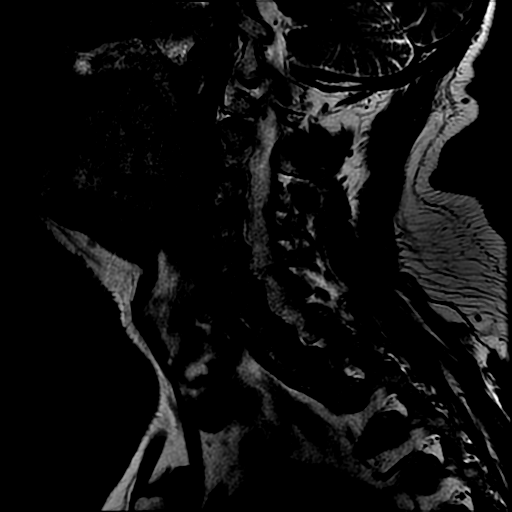
[im 13/13]
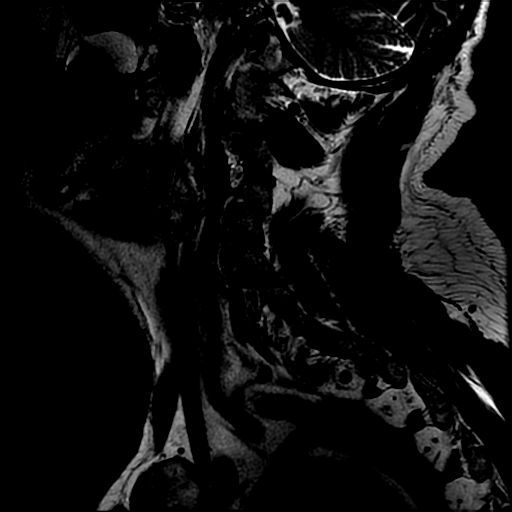

[Series 10: T2 · axial · 3.1mm · 0.35mm/px · z∈[-80,+5]mm · 8 of 29 slices shown]
[im 1/29]
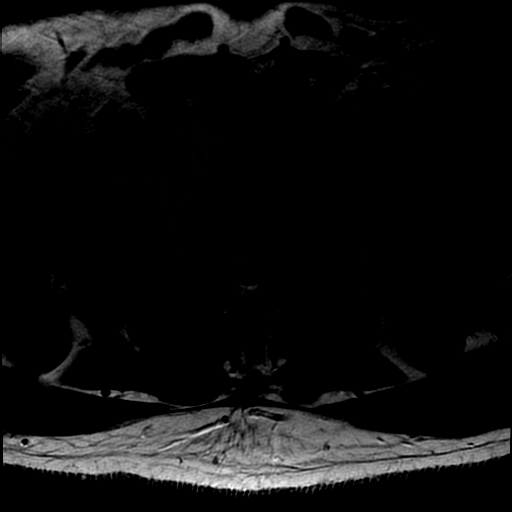
[im 3/29]
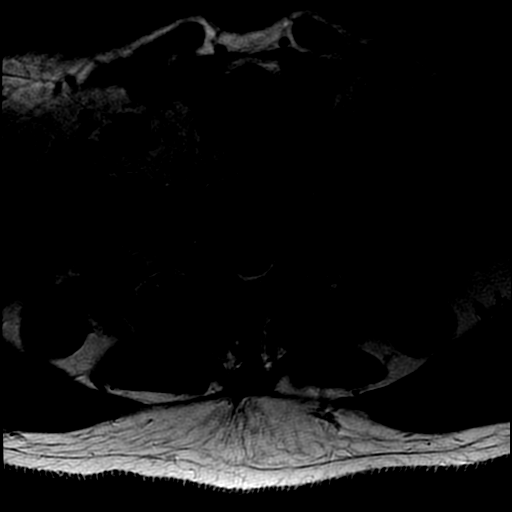
[im 6/29]
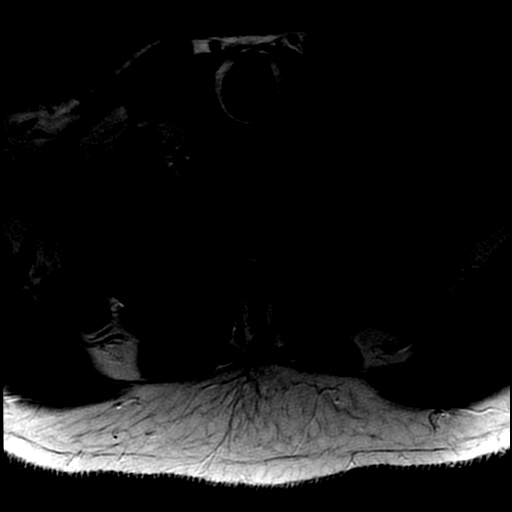
[im 9/29]
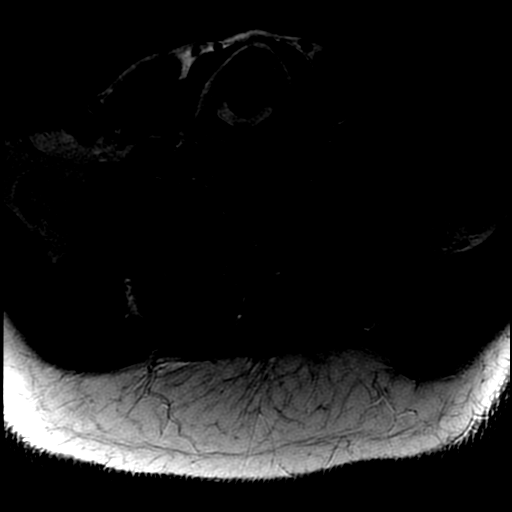
[im 12/29]
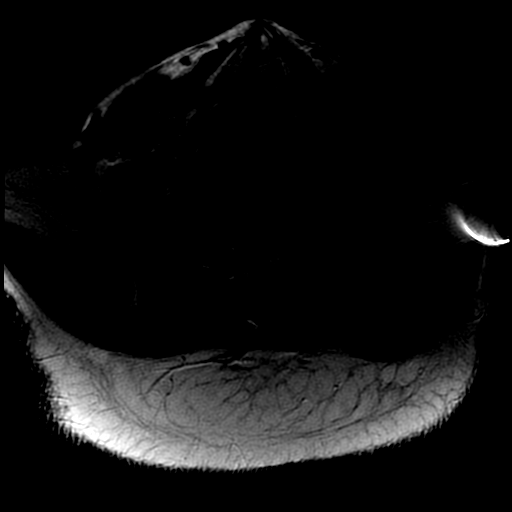
[im 15/29]
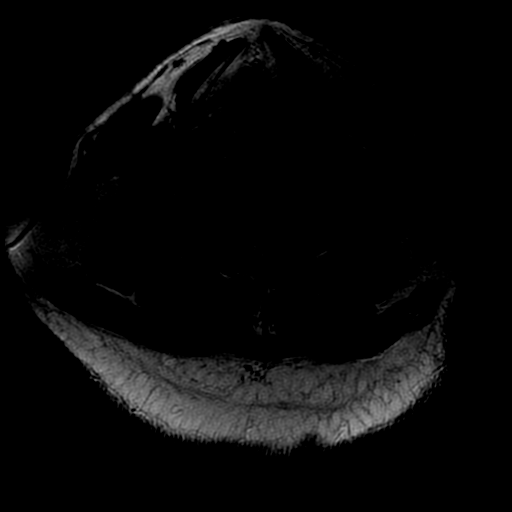
[im 17/29]
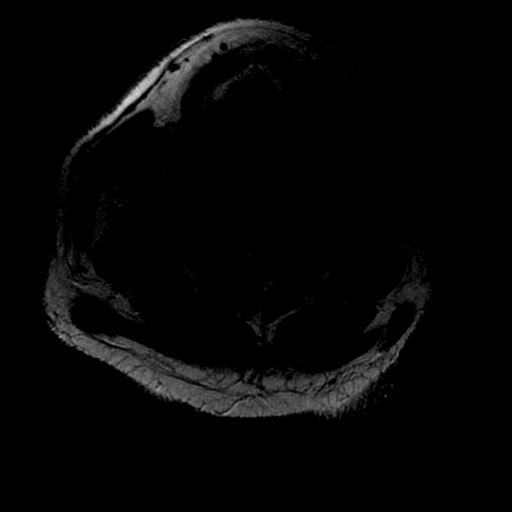
[im 26/29]
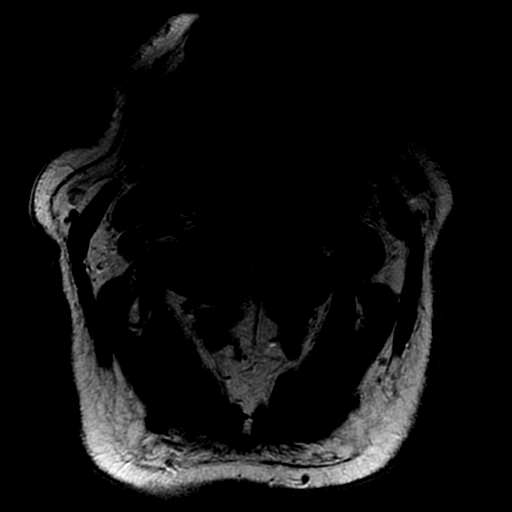

[Series 11: T1 · axial · 3.1mm · 0.35mm/px · z∈[-73,+5]mm · 3 of 29 slices shown (2 of 2)]
[im 3/29]
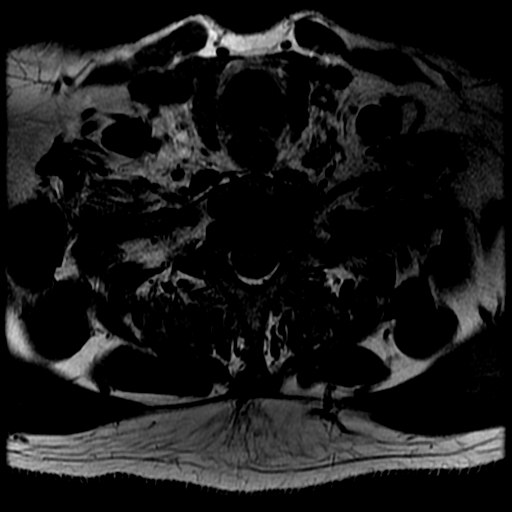
[im 15/29]
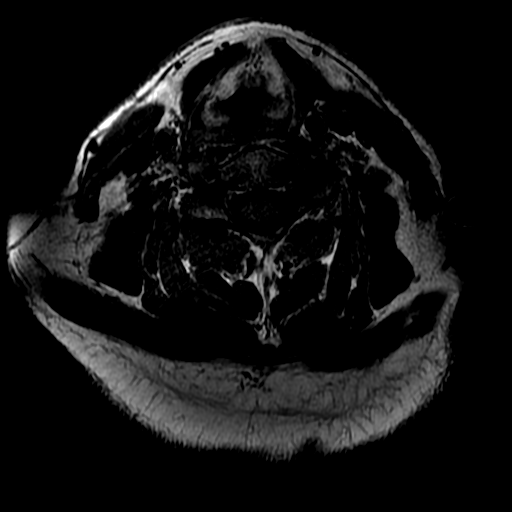
[im 26/29]
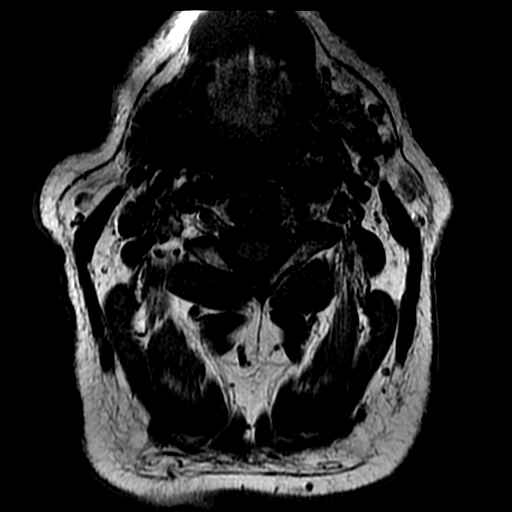

[19 of 48 positions shown; findings below may reference images not displayed]

FINDINGS: The patient was unable to remain motionless for the exam. Small or
subtle lesions could be overlooked.

Anatomic alignment except for trace anterolisthesis at C7-T1. No
worrisome osseous lesions. No tonsillar herniation. Paravertebral
soft tissues unremarkable. Flow voids are preserved in the BILATERAL
vertebral arteries.

Normal cord size and signal throughout. No significant compression
or intramedullary lesion.

At C2-3, the disc bulges mildly.  There is no impingement.

At C3-4, there is central and rightward protrusion associated with
asymmetric facet arthropathy. RIGHT C4 nerve root impingement is
likely.

At C4-5, the disc bulges mildly. There is uncinate spurring with
BILATERAL RIGHT greater than LEFT facet arthropathy, potentially
affecting both C5 nerve roots.

At C5-6, there is mild annular bulging and facet arthropathy.
BILATERAL uncinate spurring is present. Either C6 nerve root could
be affected.

At C6-7, the disc bulges mildly.  There is no impingement.

At C7-T1, there is trace facet mediated slip. BILATERAL facet
arthropathy is noted without C8 nerve root impingement or stenosis.
IMPRESSION: Multilevel spondylosis as described. No dominant left-sided lesion
is observed, but potential for left-sided nerve root impingement is
possible at C4-5 or C5-6.

No areas of cord compression or abnormal cord signal. No
intramedullary or intraspinal lesions are observed.

## 2016-09-03 DIAGNOSIS — D225 Melanocytic nevi of trunk: Secondary | ICD-10-CM | POA: Diagnosis not present

## 2016-09-03 DIAGNOSIS — L814 Other melanin hyperpigmentation: Secondary | ICD-10-CM | POA: Diagnosis not present

## 2016-09-03 DIAGNOSIS — L821 Other seborrheic keratosis: Secondary | ICD-10-CM | POA: Diagnosis not present

## 2016-09-03 DIAGNOSIS — D1801 Hemangioma of skin and subcutaneous tissue: Secondary | ICD-10-CM | POA: Diagnosis not present

## 2016-09-03 DIAGNOSIS — L918 Other hypertrophic disorders of the skin: Secondary | ICD-10-CM | POA: Diagnosis not present

## 2016-09-27 DIAGNOSIS — I10 Essential (primary) hypertension: Secondary | ICD-10-CM | POA: Diagnosis not present

## 2016-09-27 DIAGNOSIS — M1990 Unspecified osteoarthritis, unspecified site: Secondary | ICD-10-CM | POA: Diagnosis not present

## 2016-09-27 DIAGNOSIS — Z6827 Body mass index (BMI) 27.0-27.9, adult: Secondary | ICD-10-CM | POA: Diagnosis not present

## 2016-09-27 DIAGNOSIS — J302 Other seasonal allergic rhinitis: Secondary | ICD-10-CM | POA: Diagnosis not present

## 2016-09-27 DIAGNOSIS — E784 Other hyperlipidemia: Secondary | ICD-10-CM | POA: Diagnosis not present

## 2016-09-27 DIAGNOSIS — E663 Overweight: Secondary | ICD-10-CM | POA: Diagnosis not present

## 2016-11-05 DIAGNOSIS — H2513 Age-related nuclear cataract, bilateral: Secondary | ICD-10-CM | POA: Diagnosis not present

## 2016-12-13 DIAGNOSIS — Z23 Encounter for immunization: Secondary | ICD-10-CM | POA: Diagnosis not present

## 2017-03-28 DIAGNOSIS — I10 Essential (primary) hypertension: Secondary | ICD-10-CM | POA: Diagnosis not present

## 2017-03-28 DIAGNOSIS — Z125 Encounter for screening for malignant neoplasm of prostate: Secondary | ICD-10-CM | POA: Diagnosis not present

## 2017-03-28 DIAGNOSIS — E7849 Other hyperlipidemia: Secondary | ICD-10-CM | POA: Diagnosis not present

## 2017-03-28 DIAGNOSIS — R82998 Other abnormal findings in urine: Secondary | ICD-10-CM | POA: Diagnosis not present

## 2017-04-01 DIAGNOSIS — M1990 Unspecified osteoarthritis, unspecified site: Secondary | ICD-10-CM | POA: Diagnosis not present

## 2017-04-01 DIAGNOSIS — Z0000 Encounter for general adult medical examination without abnormal findings: Secondary | ICD-10-CM | POA: Diagnosis not present

## 2017-04-01 DIAGNOSIS — J302 Other seasonal allergic rhinitis: Secondary | ICD-10-CM | POA: Diagnosis not present

## 2017-04-01 DIAGNOSIS — I10 Essential (primary) hypertension: Secondary | ICD-10-CM | POA: Diagnosis not present

## 2017-04-01 DIAGNOSIS — E7849 Other hyperlipidemia: Secondary | ICD-10-CM | POA: Diagnosis not present

## 2017-04-01 DIAGNOSIS — Z6827 Body mass index (BMI) 27.0-27.9, adult: Secondary | ICD-10-CM | POA: Diagnosis not present

## 2017-04-01 DIAGNOSIS — Z1389 Encounter for screening for other disorder: Secondary | ICD-10-CM | POA: Diagnosis not present

## 2017-04-04 DIAGNOSIS — Z1212 Encounter for screening for malignant neoplasm of rectum: Secondary | ICD-10-CM | POA: Diagnosis not present

## 2017-07-22 DIAGNOSIS — L821 Other seborrheic keratosis: Secondary | ICD-10-CM | POA: Diagnosis not present

## 2017-07-22 DIAGNOSIS — D229 Melanocytic nevi, unspecified: Secondary | ICD-10-CM | POA: Diagnosis not present

## 2017-07-22 DIAGNOSIS — B351 Tinea unguium: Secondary | ICD-10-CM | POA: Diagnosis not present

## 2017-09-30 DIAGNOSIS — Z6827 Body mass index (BMI) 27.0-27.9, adult: Secondary | ICD-10-CM | POA: Diagnosis not present

## 2017-09-30 DIAGNOSIS — E7849 Other hyperlipidemia: Secondary | ICD-10-CM | POA: Diagnosis not present

## 2017-09-30 DIAGNOSIS — M1990 Unspecified osteoarthritis, unspecified site: Secondary | ICD-10-CM | POA: Diagnosis not present

## 2017-09-30 DIAGNOSIS — I10 Essential (primary) hypertension: Secondary | ICD-10-CM | POA: Diagnosis not present

## 2017-09-30 DIAGNOSIS — J302 Other seasonal allergic rhinitis: Secondary | ICD-10-CM | POA: Diagnosis not present

## 2017-11-14 DIAGNOSIS — H2513 Age-related nuclear cataract, bilateral: Secondary | ICD-10-CM | POA: Diagnosis not present

## 2017-12-12 DIAGNOSIS — Z23 Encounter for immunization: Secondary | ICD-10-CM | POA: Diagnosis not present

## 2018-04-03 DIAGNOSIS — Z125 Encounter for screening for malignant neoplasm of prostate: Secondary | ICD-10-CM | POA: Diagnosis not present

## 2018-04-03 DIAGNOSIS — I10 Essential (primary) hypertension: Secondary | ICD-10-CM | POA: Diagnosis not present

## 2018-04-03 DIAGNOSIS — R82998 Other abnormal findings in urine: Secondary | ICD-10-CM | POA: Diagnosis not present

## 2018-04-03 DIAGNOSIS — E7849 Other hyperlipidemia: Secondary | ICD-10-CM | POA: Diagnosis not present

## 2018-04-14 DIAGNOSIS — Z1331 Encounter for screening for depression: Secondary | ICD-10-CM | POA: Diagnosis not present

## 2018-04-14 DIAGNOSIS — I10 Essential (primary) hypertension: Secondary | ICD-10-CM | POA: Diagnosis not present

## 2018-04-14 DIAGNOSIS — Z6826 Body mass index (BMI) 26.0-26.9, adult: Secondary | ICD-10-CM | POA: Diagnosis not present

## 2018-04-14 DIAGNOSIS — E663 Overweight: Secondary | ICD-10-CM | POA: Diagnosis not present

## 2018-04-14 DIAGNOSIS — J302 Other seasonal allergic rhinitis: Secondary | ICD-10-CM | POA: Diagnosis not present

## 2018-04-14 DIAGNOSIS — Z1339 Encounter for screening examination for other mental health and behavioral disorders: Secondary | ICD-10-CM | POA: Diagnosis not present

## 2018-04-14 DIAGNOSIS — M1990 Unspecified osteoarthritis, unspecified site: Secondary | ICD-10-CM | POA: Diagnosis not present

## 2018-04-14 DIAGNOSIS — Z0000 Encounter for general adult medical examination without abnormal findings: Secondary | ICD-10-CM | POA: Diagnosis not present

## 2018-04-14 DIAGNOSIS — E7849 Other hyperlipidemia: Secondary | ICD-10-CM | POA: Diagnosis not present

## 2018-04-15 DIAGNOSIS — Z1212 Encounter for screening for malignant neoplasm of rectum: Secondary | ICD-10-CM | POA: Diagnosis not present

## 2018-10-20 DIAGNOSIS — E663 Overweight: Secondary | ICD-10-CM | POA: Diagnosis not present

## 2018-10-20 DIAGNOSIS — J302 Other seasonal allergic rhinitis: Secondary | ICD-10-CM | POA: Diagnosis not present

## 2018-10-20 DIAGNOSIS — I10 Essential (primary) hypertension: Secondary | ICD-10-CM | POA: Diagnosis not present

## 2018-10-20 DIAGNOSIS — M1990 Unspecified osteoarthritis, unspecified site: Secondary | ICD-10-CM | POA: Diagnosis not present

## 2018-10-20 DIAGNOSIS — E785 Hyperlipidemia, unspecified: Secondary | ICD-10-CM | POA: Diagnosis not present

## 2018-10-23 DIAGNOSIS — Z23 Encounter for immunization: Secondary | ICD-10-CM | POA: Diagnosis not present

## 2018-11-20 DIAGNOSIS — H524 Presbyopia: Secondary | ICD-10-CM | POA: Diagnosis not present

## 2018-11-20 DIAGNOSIS — H2513 Age-related nuclear cataract, bilateral: Secondary | ICD-10-CM | POA: Diagnosis not present

## 2018-11-20 DIAGNOSIS — H25013 Cortical age-related cataract, bilateral: Secondary | ICD-10-CM | POA: Diagnosis not present

## 2018-11-20 DIAGNOSIS — H5203 Hypermetropia, bilateral: Secondary | ICD-10-CM | POA: Diagnosis not present

## 2018-11-20 DIAGNOSIS — H52203 Unspecified astigmatism, bilateral: Secondary | ICD-10-CM | POA: Diagnosis not present

## 2019-02-26 ENCOUNTER — Ambulatory Visit: Payer: Medicare Other | Attending: Internal Medicine

## 2019-02-26 DIAGNOSIS — Z20822 Contact with and (suspected) exposure to covid-19: Secondary | ICD-10-CM

## 2019-02-28 LAB — NOVEL CORONAVIRUS, NAA: SARS-CoV-2, NAA: NOT DETECTED

## 2019-03-23 DIAGNOSIS — Z23 Encounter for immunization: Secondary | ICD-10-CM | POA: Diagnosis not present

## 2019-04-20 DIAGNOSIS — Z23 Encounter for immunization: Secondary | ICD-10-CM | POA: Diagnosis not present

## 2019-05-18 DIAGNOSIS — E7849 Other hyperlipidemia: Secondary | ICD-10-CM | POA: Diagnosis not present

## 2019-05-18 DIAGNOSIS — Z125 Encounter for screening for malignant neoplasm of prostate: Secondary | ICD-10-CM | POA: Diagnosis not present

## 2019-05-25 DIAGNOSIS — Z0000 Encounter for general adult medical examination without abnormal findings: Secondary | ICD-10-CM | POA: Diagnosis not present

## 2019-05-25 DIAGNOSIS — E785 Hyperlipidemia, unspecified: Secondary | ICD-10-CM | POA: Diagnosis not present

## 2019-05-25 DIAGNOSIS — J302 Other seasonal allergic rhinitis: Secondary | ICD-10-CM | POA: Diagnosis not present

## 2019-05-25 DIAGNOSIS — E663 Overweight: Secondary | ICD-10-CM | POA: Diagnosis not present

## 2019-05-25 DIAGNOSIS — M1990 Unspecified osteoarthritis, unspecified site: Secondary | ICD-10-CM | POA: Diagnosis not present

## 2019-05-25 DIAGNOSIS — R82998 Other abnormal findings in urine: Secondary | ICD-10-CM | POA: Diagnosis not present

## 2019-05-25 DIAGNOSIS — I10 Essential (primary) hypertension: Secondary | ICD-10-CM | POA: Diagnosis not present

## 2019-05-26 DIAGNOSIS — Z1212 Encounter for screening for malignant neoplasm of rectum: Secondary | ICD-10-CM | POA: Diagnosis not present

## 2019-10-15 DIAGNOSIS — H02135 Senile ectropion of left lower eyelid: Secondary | ICD-10-CM | POA: Diagnosis not present

## 2019-10-15 DIAGNOSIS — H04223 Epiphora due to insufficient drainage, bilateral lacrimal glands: Secondary | ICD-10-CM | POA: Diagnosis not present

## 2019-10-15 DIAGNOSIS — H04522 Eversion of left lacrimal punctum: Secondary | ICD-10-CM | POA: Diagnosis not present

## 2019-10-15 DIAGNOSIS — H04523 Eversion of bilateral lacrimal punctum: Secondary | ICD-10-CM | POA: Diagnosis not present

## 2019-10-15 DIAGNOSIS — H02832 Dermatochalasis of right lower eyelid: Secondary | ICD-10-CM | POA: Diagnosis not present

## 2019-10-15 DIAGNOSIS — H02132 Senile ectropion of right lower eyelid: Secondary | ICD-10-CM | POA: Diagnosis not present

## 2019-10-15 DIAGNOSIS — H16213 Exposure keratoconjunctivitis, bilateral: Secondary | ICD-10-CM | POA: Diagnosis not present

## 2019-10-15 DIAGNOSIS — H16211 Exposure keratoconjunctivitis, right eye: Secondary | ICD-10-CM | POA: Diagnosis not present

## 2019-10-15 DIAGNOSIS — H02835 Dermatochalasis of left lower eyelid: Secondary | ICD-10-CM | POA: Diagnosis not present

## 2019-10-15 DIAGNOSIS — H04521 Eversion of right lacrimal punctum: Secondary | ICD-10-CM | POA: Diagnosis not present

## 2019-10-15 DIAGNOSIS — H16212 Exposure keratoconjunctivitis, left eye: Secondary | ICD-10-CM | POA: Diagnosis not present

## 2019-10-15 DIAGNOSIS — H04123 Dry eye syndrome of bilateral lacrimal glands: Secondary | ICD-10-CM | POA: Diagnosis not present

## 2019-11-23 DIAGNOSIS — H2513 Age-related nuclear cataract, bilateral: Secondary | ICD-10-CM | POA: Diagnosis not present

## 2019-11-30 DIAGNOSIS — I10 Essential (primary) hypertension: Secondary | ICD-10-CM | POA: Diagnosis not present

## 2019-11-30 DIAGNOSIS — Z23 Encounter for immunization: Secondary | ICD-10-CM | POA: Diagnosis not present

## 2019-11-30 DIAGNOSIS — E663 Overweight: Secondary | ICD-10-CM | POA: Diagnosis not present

## 2019-11-30 DIAGNOSIS — M1990 Unspecified osteoarthritis, unspecified site: Secondary | ICD-10-CM | POA: Diagnosis not present

## 2019-12-07 ENCOUNTER — Encounter: Payer: Self-pay | Admitting: Dermatology

## 2019-12-07 ENCOUNTER — Ambulatory Visit (INDEPENDENT_AMBULATORY_CARE_PROVIDER_SITE_OTHER): Payer: Medicare Other | Admitting: Dermatology

## 2019-12-07 ENCOUNTER — Other Ambulatory Visit: Payer: Self-pay

## 2019-12-07 DIAGNOSIS — D229 Melanocytic nevi, unspecified: Secondary | ICD-10-CM

## 2019-12-07 DIAGNOSIS — D225 Melanocytic nevi of trunk: Secondary | ICD-10-CM | POA: Diagnosis not present

## 2019-12-07 DIAGNOSIS — D1801 Hemangioma of skin and subcutaneous tissue: Secondary | ICD-10-CM | POA: Diagnosis not present

## 2019-12-07 DIAGNOSIS — Z1283 Encounter for screening for malignant neoplasm of skin: Secondary | ICD-10-CM

## 2019-12-07 DIAGNOSIS — L738 Other specified follicular disorders: Secondary | ICD-10-CM

## 2019-12-07 DIAGNOSIS — L219 Seborrheic dermatitis, unspecified: Secondary | ICD-10-CM

## 2019-12-07 MED ORDER — HYDROCORTISONE 2.5 % EX LOTN: TOPICAL_LOTION | Freq: Two times a day (BID) | CUTANEOUS | 0 refills | Status: DC

## 2019-12-07 NOTE — Patient Instructions (Signed)
Routine follow-up for Daniel Heath date of birth 1946-01-04.  All skin from lower legs to the top of his head examined.  There are no atypical moles, melanoma, or nonmobile skin cancer.  He has about 2 dozen rough tan-brown flattopped papules called keratoses.  The largest is on the left side.  There is a half dozen on the back and multiple smaller ones on the temples.  These require no intervention.  He has several dozen smooth red bumps mainly on the torso with the largest one being on the right front rib cage; these are angiomas and also safe to ignore.  On the inner cheek and forehead are some subtle flesh-colored 2 mm bumps called sebaceous hyperplasia which may resemble nonmelanoma skin cancer but are intrinsically benign.  1 irregular mole on the right lower back shows perfectly normal dermoscopy and can be left if clinically stable.  I encourage Daniel Heath to examine his own skin twice annually and we will see him once yearly or as needed if he sees a change.

## 2019-12-07 NOTE — Progress Notes (Signed)
   Follow-Up Visit   Subjective  Daniel Heath is a 74 y.o. male who presents for the following: Annual Exam (Patient here today for yearly skin check.  No concerns per patient).  Annual skin exam Location:  Duration:  Quality:  Associated Signs/Symptoms: Modifying Factors:  Severity:  Timing: Context:   Objective  Well appearing patient in no apparent distress; mood and affect are within normal limits.  A full examination was performed including scalp, head, eyes, ears, nose, lips, neck, chest, axillae, abdomen, back, buttocks, bilateral upper extremities, bilateral lower extremities, hands, feet, fingers, toes, fingernails, and toenails. All findings within normal limits unless otherwise noted below.   Assessment & Plan    Routine follow-up for Daniel Heath date of birth 1945/09/24.  All skin from lower legs to the top of his head examined.  There are no atypical moles, melanoma, or nonmobile skin cancer.  He has about 2 dozen rough tan-brown flattopped papules called keratoses.  The largest is on the left side.  There is a half dozen on the back and multiple smaller ones on the temples.  These require no intervention.  He has several dozen smooth red bumps mainly on the torso with the largest one being on the right front rib cage; these are angiomas and also safe to ignore.  On the inner cheek and forehead are some subtle flesh-colored 2 mm bumps called sebaceous hyperplasia which may resemble nonmelanoma skin cancer but are intrinsically benign.  1 irregular mole on the right lower back shows perfectly normal dermoscopy and can be left if clinically stable.  I encourage Daniel Heath to examine his own skin twice annually and we will see him once yearly or as needed if he sees a change.    Screening for malignant neoplasm of skin Mid Back  Full skin exam. No atypical moles or non mole skin cancers. Follow up for yearly skin exam.   Nevus Mid Back  Mole on back shows no  atypia.   Seborrheic dermatitis (2) Right Triangular Fossa; Left Cymba  hydrocortisone 2.5 % lotion - Left Cymba, Right Triangular Fossa     I, Lavonna Monarch, MD, have reviewed all documentation for this visit.  The documentation on 12/10/19 for the exam, diagnosis, procedures, and orders are all accurate and complete.

## 2020-01-09 DIAGNOSIS — Z23 Encounter for immunization: Secondary | ICD-10-CM | POA: Diagnosis not present

## 2020-01-15 ENCOUNTER — Encounter: Payer: Self-pay | Admitting: Dermatology

## 2020-01-22 DIAGNOSIS — L509 Urticaria, unspecified: Secondary | ICD-10-CM | POA: Diagnosis not present

## 2020-02-04 ENCOUNTER — Ambulatory Visit: Payer: Medicare Other | Admitting: Dermatology

## 2020-02-08 ENCOUNTER — Encounter: Payer: Self-pay | Admitting: Dermatology

## 2020-02-08 ENCOUNTER — Other Ambulatory Visit: Payer: Self-pay

## 2020-02-08 ENCOUNTER — Ambulatory Visit (INDEPENDENT_AMBULATORY_CARE_PROVIDER_SITE_OTHER): Payer: Medicare Other | Admitting: Dermatology

## 2020-02-08 DIAGNOSIS — Z1283 Encounter for screening for malignant neoplasm of skin: Secondary | ICD-10-CM

## 2020-02-08 DIAGNOSIS — D485 Neoplasm of uncertain behavior of skin: Secondary | ICD-10-CM | POA: Diagnosis not present

## 2020-02-08 DIAGNOSIS — L820 Inflamed seborrheic keratosis: Secondary | ICD-10-CM | POA: Diagnosis not present

## 2020-02-08 NOTE — Patient Instructions (Signed)

## 2020-02-10 ENCOUNTER — Encounter: Payer: Self-pay | Admitting: Dermatology

## 2020-02-10 NOTE — Progress Notes (Signed)
   Follow-Up Visit   Subjective  Daniel Heath is a 74 y.o. male who presents for the following: Skin Problem (Left and right sideburn- raised spot). New spots face Location:  Duration: Several months Quality: 1 on right sideburn larger Associated Signs/Symptoms: Modifying Factors:  Severity:  Timing: Context:   Objective  Well appearing patient in no apparent distress; mood and affect are within normal limits. Objective  Right Parotid Area: Pink-tan 7 mm crust     Objective  Mid Back: Waist up skin examination, no atypical moles or melanoma   All skin waist up examined.   Assessment & Plan    Neoplasm of uncertain behavior of skin Right Parotid Area  Skin / nail biopsy Type of biopsy: tangential   Informed consent: discussed and consent obtained   Timeout: patient name, date of birth, surgical site, and procedure verified   Procedure prep:  Patient was prepped and draped in usual sterile fashion (Non sterile) Prep type:  Chlorhexidine Anesthesia: the lesion was anesthetized in a standard fashion   Anesthetic:  1% lidocaine w/ epinephrine 1-100,000 local infiltration Instrument used: flexible razor blade   Outcome: patient tolerated procedure well   Post-procedure details: wound care instructions given    Specimen 1 - Surgical pathology Differential Diagnosis: bcc vs scc  Check Margins: No  Skin exam for malignant neoplasm Mid Back  Annual skin examination, encouraged to self examine his skin twice annually.     I, Lavonna Monarch, MD, have reviewed all documentation for this visit.  The documentation on 02/10/20 for the exam, diagnosis, procedures, and orders are all accurate and complete.

## 2020-04-20 ENCOUNTER — Other Ambulatory Visit: Payer: Self-pay | Admitting: Dermatology

## 2020-04-20 DIAGNOSIS — L219 Seborrheic dermatitis, unspecified: Secondary | ICD-10-CM

## 2020-05-26 DIAGNOSIS — Z125 Encounter for screening for malignant neoplasm of prostate: Secondary | ICD-10-CM | POA: Diagnosis not present

## 2020-05-26 DIAGNOSIS — E785 Hyperlipidemia, unspecified: Secondary | ICD-10-CM | POA: Diagnosis not present

## 2020-05-30 DIAGNOSIS — Z0000 Encounter for general adult medical examination without abnormal findings: Secondary | ICD-10-CM | POA: Diagnosis not present

## 2020-05-30 DIAGNOSIS — J302 Other seasonal allergic rhinitis: Secondary | ICD-10-CM | POA: Diagnosis not present

## 2020-05-30 DIAGNOSIS — Z1212 Encounter for screening for malignant neoplasm of rectum: Secondary | ICD-10-CM | POA: Diagnosis not present

## 2020-05-30 DIAGNOSIS — R82998 Other abnormal findings in urine: Secondary | ICD-10-CM | POA: Diagnosis not present

## 2020-05-30 DIAGNOSIS — M1990 Unspecified osteoarthritis, unspecified site: Secondary | ICD-10-CM | POA: Diagnosis not present

## 2020-05-30 DIAGNOSIS — E663 Overweight: Secondary | ICD-10-CM | POA: Diagnosis not present

## 2020-05-30 DIAGNOSIS — E785 Hyperlipidemia, unspecified: Secondary | ICD-10-CM | POA: Diagnosis not present

## 2020-05-30 DIAGNOSIS — I10 Essential (primary) hypertension: Secondary | ICD-10-CM | POA: Diagnosis not present

## 2020-11-21 DIAGNOSIS — Z23 Encounter for immunization: Secondary | ICD-10-CM | POA: Diagnosis not present

## 2020-11-24 DIAGNOSIS — H2513 Age-related nuclear cataract, bilateral: Secondary | ICD-10-CM | POA: Diagnosis not present

## 2020-12-12 ENCOUNTER — Encounter: Payer: Self-pay | Admitting: Dermatology

## 2020-12-12 ENCOUNTER — Other Ambulatory Visit: Payer: Self-pay

## 2020-12-12 ENCOUNTER — Ambulatory Visit (INDEPENDENT_AMBULATORY_CARE_PROVIDER_SITE_OTHER): Payer: Medicare Other | Admitting: Dermatology

## 2020-12-12 DIAGNOSIS — D1801 Hemangioma of skin and subcutaneous tissue: Secondary | ICD-10-CM | POA: Diagnosis not present

## 2020-12-12 DIAGNOSIS — L821 Other seborrheic keratosis: Secondary | ICD-10-CM | POA: Diagnosis not present

## 2020-12-12 DIAGNOSIS — Z1283 Encounter for screening for malignant neoplasm of skin: Secondary | ICD-10-CM

## 2020-12-12 DIAGNOSIS — B351 Tinea unguium: Secondary | ICD-10-CM

## 2020-12-26 ENCOUNTER — Encounter: Payer: Self-pay | Admitting: Dermatology

## 2020-12-26 NOTE — Progress Notes (Signed)
   Follow-Up Visit   Subjective  Daniel Heath is a 75 y.o. male who presents for the following: Annual Exam (YEARLY FULL BODY NO CONCERNS, No personal history of skin cancer or atypia ).  General skin examination Location:  Duration:  Quality:  Associated Signs/Symptoms: Modifying Factors:  Severity:  Timing: Context:   Objective  Well appearing patient in no apparent distress; mood and affect are within normal limits. General skin examination: No atypical pigmented lesions or nonmelanoma skin cancer.  Right Foot - Anterior Right great toenail thickened and densely opaque with subungual debris typical of chronic tinea  Chest (Upper Torso, Anterior) Many keratoses especially torso, 3 to 71mm textured brown papules.  Abdomen (Lower Torso, Anterior) Many angioma: Smooth 1 to 2 mm red dermal papules    A full examination was performed including scalp, head, eyes, ears, nose, lips, neck, chest, axillae, abdomen, back, buttocks, bilateral upper extremities, bilateral lower extremities, hands, feet, fingers, toes, fingernails, and toenails. All findings within normal limits unless otherwise noted below.   Assessment & Plan    Toenail fungus Right Foot - Anterior  Essentially all treatment options discussed and  mutually we will choose not intervention.  Seborrheic keratosis Chest (Upper Torso, Anterior)  Leave if stable  Cherry angioma Abdomen (Lower Torso, Anterior)  No intervention necessary  Encounter for screening for malignant neoplasm of skin  Annual skin examination.      I, Lavonna Monarch, MD, have reviewed all documentation for this visit.  The documentation on 12/26/20 for the exam, diagnosis, procedures, and orders are all accurate and complete.

## 2021-01-30 DIAGNOSIS — U071 COVID-19: Secondary | ICD-10-CM | POA: Diagnosis not present

## 2021-01-31 DIAGNOSIS — J060 Acute laryngopharyngitis: Secondary | ICD-10-CM | POA: Diagnosis not present

## 2021-01-31 DIAGNOSIS — U071 COVID-19: Secondary | ICD-10-CM | POA: Diagnosis not present

## 2021-06-08 DIAGNOSIS — E785 Hyperlipidemia, unspecified: Secondary | ICD-10-CM | POA: Diagnosis not present

## 2021-06-08 DIAGNOSIS — Z125 Encounter for screening for malignant neoplasm of prostate: Secondary | ICD-10-CM | POA: Diagnosis not present

## 2021-06-08 DIAGNOSIS — I10 Essential (primary) hypertension: Secondary | ICD-10-CM | POA: Diagnosis not present

## 2021-06-12 DIAGNOSIS — E785 Hyperlipidemia, unspecified: Secondary | ICD-10-CM | POA: Diagnosis not present

## 2021-06-12 DIAGNOSIS — Z0000 Encounter for general adult medical examination without abnormal findings: Secondary | ICD-10-CM | POA: Diagnosis not present

## 2021-06-12 DIAGNOSIS — E663 Overweight: Secondary | ICD-10-CM | POA: Diagnosis not present

## 2021-06-12 DIAGNOSIS — Z1331 Encounter for screening for depression: Secondary | ICD-10-CM | POA: Diagnosis not present

## 2021-06-12 DIAGNOSIS — I10 Essential (primary) hypertension: Secondary | ICD-10-CM | POA: Diagnosis not present

## 2021-06-12 DIAGNOSIS — Z1339 Encounter for screening examination for other mental health and behavioral disorders: Secondary | ICD-10-CM | POA: Diagnosis not present

## 2021-06-12 DIAGNOSIS — J302 Other seasonal allergic rhinitis: Secondary | ICD-10-CM | POA: Diagnosis not present

## 2021-06-12 DIAGNOSIS — R82998 Other abnormal findings in urine: Secondary | ICD-10-CM | POA: Diagnosis not present

## 2021-08-16 ENCOUNTER — Encounter: Payer: Self-pay | Admitting: Gastroenterology

## 2021-09-04 ENCOUNTER — Encounter: Payer: Self-pay | Admitting: Gastroenterology

## 2021-10-12 ENCOUNTER — Ambulatory Visit: Payer: Medicare Other | Admitting: Gastroenterology

## 2021-11-01 ENCOUNTER — Encounter: Payer: Self-pay | Admitting: Gastroenterology

## 2021-11-27 ENCOUNTER — Ambulatory Visit: Payer: Medicare Other | Admitting: Gastroenterology

## 2021-12-04 ENCOUNTER — Ambulatory Visit: Payer: Medicare Other | Admitting: Gastroenterology

## 2021-12-04 DIAGNOSIS — H25813 Combined forms of age-related cataract, bilateral: Secondary | ICD-10-CM | POA: Diagnosis not present

## 2021-12-14 ENCOUNTER — Ambulatory Visit (INDEPENDENT_AMBULATORY_CARE_PROVIDER_SITE_OTHER): Payer: Medicare Other | Admitting: Gastroenterology

## 2021-12-14 ENCOUNTER — Encounter: Payer: Self-pay | Admitting: Gastroenterology

## 2021-12-14 VITALS — BP 120/80 | HR 80 | Ht 67.5 in | Wt 183.4 lb

## 2021-12-14 DIAGNOSIS — Z1211 Encounter for screening for malignant neoplasm of colon: Secondary | ICD-10-CM

## 2021-12-14 MED ORDER — NA SULFATE-K SULFATE-MG SULF 17.5-3.13-1.6 GM/177ML PO SOLN: 1.0000 | Freq: Once | ORAL | 0 refills | Status: AC

## 2021-12-14 NOTE — Progress Notes (Signed)
[facility] Consult Note:  History: Daniel Heath 12/14/2021  Referring provider: Burnard Bunting, MD  Reason for consult/chief complaint: Colon Cancer Screening (Recall colon, No adenomatous polyps just hyperplastic)   Subjective  HPI:  Daniel Heath is a very pleasant 76 year old man here to discuss screening colonoscopy.  Last screening colonoscopy June 2013 reported a somewhat challenging scope passage with severe left colon diverticulosis, otherwise normal exam with removal of diminutive hyperplastic polyp.  His health is excellent overall, he has no chronic digestive symptoms such as frequent heartburn, dysphagia, altered bowel habits, weight loss or rectal bleeding.  ROS:  Review of Systems He denies chest pain dyspnea or dysuria Chronic arthritis and seasonal allergies  Past Medical History: Past Medical History:  Diagnosis Date   Arthritis    Hyperlipidemia    Hypertension    Perennial allergic rhinitis      Past Surgical History: Past Surgical History:  Procedure Laterality Date   COLONOSCOPY  2003 & 2013   diverticulosis; polyp 2013   TONSILLECTOMY AND ADENOIDECTOMY  at 76 yrs old     Family History: Family History  Problem Relation Age of Onset   Cancer Father 40       ?gallbladder,wide spread metastases   Lung cancer Father        occasional cigar smoker; asbestos exposure   Stroke Paternal Grandmother        late 58s   Colon cancer Neg Hx    Diabetes Neg Hx    Heart disease Neg Hx     Social History: Social History   Socioeconomic History   Marital status: Single    Spouse name: Not on file   Number of children: 0   Years of education: Not on file   Highest education level: Not on file  Occupational History   Occupation: Art gallery manager  Tobacco Use   Smoking status: Former    Types: Cigarettes    Quit date: 02/20/1968    Years since quitting: 53.8   Smokeless tobacco: Never   Tobacco comments:    smoked 1961-1970,up to  1/2 ppd  Vaping Use   Vaping Use: Never used  Substance and Sexual Activity   Alcohol use: Yes    Alcohol/week: 17.0 standard drinks of alcohol    Types: 3 Standard drinks or equivalent, 14 Glasses of wine per week    Comment: Daily wine or liquor 2 drink per night   Drug use: No   Sexual activity: Not on file  Other Topics Concern   Not on file  Social History Narrative   Not on file   Social Determinants of Health   Financial Resource Strain: Not on file  Food Insecurity: Not on file  Transportation Needs: Not on file  Physical Activity: Not on file  Stress: Not on file  Social Connections: Not on file    Allergies: Allergies  Allergen Reactions   Benazepril Hcl Cough    REACTION: cough    Outpatient Meds: Current Outpatient Medications  Medication Sig Dispense Refill   amLODipine (NORVASC) 5 MG tablet Take 1 tablet (5 mg total) by mouth daily. Must estab w/new PCP for future refills 90 tablet 1   aspirin 81 MG tablet Take 81 mg by mouth daily.     atorvastatin (LIPITOR) 20 MG tablet TAKE 1 TABLET (20 MG TOTAL) BY MOUTH DAILY. 90 tablet 1   B Complex-C (SUPER B COMPLEX PO) Take 1 capsule by mouth daily.     celecoxib (CELEBREX) 200 MG  capsule TAKE 1 CAPSULE (200 MG TOTAL) BY MOUTH 2 (TWO) TIMES DAILY AS NEEDED. 60 capsule 0   Cholecalciferol (VITAMIN D3) 1000 UNITS CAPS Take 1 capsule by mouth daily.     Coenzyme Q10 (COQ10) 200 MG CAPS Take 1 capsule by mouth daily.      Cyanocobalamin (B-12) 1000 MCG SUBL Place under the tongue.     fexofenadine (ALLEGRA) 30 MG tablet Take 30 mg by mouth 2 (two) times daily.     Flaxseed, Linseed, (FLAX SEEDS PO) Take 1 capsule by mouth daily.      fluticasone (FLONASE) 50 MCG/ACT nasal spray Place 2 sprays into the nose as directed. 1 spray in each nostril twice a day as needed. Use the "crossover" technique as discussed  16 g 3   hydrocortisone 2.5 % lotion APPLY TOPICALLY 2 (TWO) TIMES DAILY. X 1 MONTH. 59 mL 1   ibuprofen  (ADVIL,MOTRIN) 200 MG tablet Take 400 mg by mouth every 6 (six) hours as needed (pain).     loratadine (CLARITIN) 10 MG tablet Take 10 mg by mouth daily as needed for allergies (allergies).      losartan (COZAAR) 50 MG tablet Take 100 mg by mouth daily.     Probiotic Product (ALIGN) 4 MG CAPS Take 1 capsule by mouth daily.      Saw Palmetto, Serenoa repens, (SAW PALMETTO PO) Take 1 capsule by mouth daily.     Turmeric Curcumin 500 MG CAPS Take 2 capsules by mouth daily.      No current facility-administered medications for this visit.      ___________________________________________________________________ Objective   Exam:  BP 120/80 (BP Location: Left Arm, Patient Position: Sitting, Cuff Size: Normal)   Pulse 80 Comment: irregular  Ht 5' 7.5" (1.715 m) Comment: height measured without shoes  Wt 183 lb 6 oz (83.2 kg)   BMI 28.30 kg/m  Wt Readings from Last 3 Encounters:  12/14/21 183 lb 6 oz (83.2 kg)  12/23/14 188 lb (85.3 kg)  03/31/14 185 lb (83.9 kg)    General: Well-appearing Eyes: sclera anicteric, no redness ENT: oral mucosa moist without lesions, no cervical or supraclavicular lymphadenopathy CV: Regular without murmur, no JVD, no peripheral edema Resp: clear to auscultation bilaterally, normal RR and effort noted GI: soft, no tenderness, with active bowel sounds. No guarding or palpable organomegaly noted. Skin; warm and dry, no rash or jaundice noted Neuro: awake, alert and oriented x 3. Normal gross motor function and fluent speech  Labs: No recent data for review Assessment: Encounter Diagnosis  Name Primary?   Special screening for malignant neoplasms, colon Yes    Average risk for colorectal cancer, excellent health overall, appropriate for screening colonoscopy.  He was agreeable after thorough discussion of procedure and risks.  The benefits and risks of the planned procedure were described in detail with the patient or (when appropriate) their health  care proxy.  Risks were outlined as including, but not limited to, bleeding, infection, perforation, adverse medication reaction leading to cardiac or pulmonary decompensation, pancreatitis (if ERCP).  The limitation of incomplete mucosal visualization was also discussed.  No guarantees or warranties were given.  Thank you for the courtesy of this consult.  Please call me with any questions or concerns.  Nelida Meuse III  CC: Referring provider noted above

## 2021-12-14 NOTE — Patient Instructions (Signed)
_______________________________________________________  If you are age 77 or older, your body mass index should be between 23-30. Your Body mass index is 28.3 kg/m. If this is out of the aforementioned range listed, please consider follow up with your Primary Care Provider.  If you are age 80 or younger, your body mass index should be between 19-25. Your Body mass index is 28.3 kg/m. If this is out of the aformentioned range listed, please consider follow up with your Primary Care Provider.   ________________________________________________________  The [facility] providers would like to encourage you to use [facility] to communicate with providers for non-urgent requests or questions.  Due to long hold times on the telephone, sending your provider a message by [facility] may be a faster and more efficient way to get a response.  Please allow 48 business hours for a response.  Please remember that this is for non-urgent requests.  _______________________________________________________   Daniel Heath have been scheduled for a colonoscopy. Please follow written instructions given to you at your visit today.  Please pick up your prep supplies at the pharmacy within the next 1-3 days. If you use inhalers (even only as needed), please bring them with you on the day of your procedure.   It was a pleasure to see you today!  Thank you for trusting me with your gastrointestinal care!

## 2021-12-18 ENCOUNTER — Encounter: Payer: Medicare Other | Admitting: Gastroenterology

## 2021-12-18 ENCOUNTER — Ambulatory Visit: Payer: Medicare Other | Admitting: Dermatology

## 2022-01-01 ENCOUNTER — Encounter: Payer: Self-pay | Admitting: Gastroenterology

## 2022-01-01 ENCOUNTER — Ambulatory Visit (AMBULATORY_SURGERY_CENTER): Payer: Medicare Other | Admitting: Gastroenterology

## 2022-01-01 VITALS — BP 115/61 | HR 62 | Temp 96.9°F | Resp 17 | Ht 67.0 in | Wt 183.0 lb

## 2022-01-01 DIAGNOSIS — D123 Benign neoplasm of transverse colon: Secondary | ICD-10-CM | POA: Diagnosis not present

## 2022-01-01 DIAGNOSIS — Z1211 Encounter for screening for malignant neoplasm of colon: Secondary | ICD-10-CM

## 2022-01-01 MED ORDER — SODIUM CHLORIDE 0.9 % IV SOLN: 500.0000 mL | Freq: Once | INTRAVENOUS | Status: DC

## 2022-01-01 NOTE — Patient Instructions (Signed)
YOU HAD AN ENDOSCOPIC PROCEDURE TODAY AT THE  ENDOSCOPY CENTER:   Refer to the procedure report that was given to you for any specific questions about what was found during the examination.  If the procedure report does not answer your questions, please call your gastroenterologist to clarify.  If you requested that your care partner not be given the details of your procedure findings, then the procedure report has been included in a sealed envelope for you to review at your convenience later.  YOU SHOULD EXPECT: Some feelings of bloating in the abdomen. Passage of more gas than usual.  Walking can help get rid of the air that was put into your GI tract during the procedure and reduce the bloating. If you had a lower endoscopy (such as a colonoscopy or flexible sigmoidoscopy) you may notice spotting of blood in your stool or on the toilet paper. If you underwent a bowel prep for your procedure, you may not have a normal bowel movement for a few days.  Please Note:  You might notice some irritation and congestion in your nose or some drainage.  This is from the oxygen used during your procedure.  There is no need for concern and it should clear up in a day or so.  SYMPTOMS TO REPORT IMMEDIATELY:  Following lower endoscopy (colonoscopy or flexible sigmoidoscopy):  Excessive amounts of blood in the stool  Significant tenderness or worsening of abdominal pains  Swelling of the abdomen that is new, acute  Fever of 100F or higher  For urgent or emergent issues, a gastroenterologist can be reached at any hour by calling [phone]. Do not use MyChart messaging for urgent concerns.    DIET:  We do recommend a small meal at first, but then you may proceed to your regular diet.  Drink plenty of fluids but you should avoid alcoholic beverages for 24 hours.  ACTIVITY:  You should plan to take it easy for the rest of today and you should NOT DRIVE or use heavy machinery until tomorrow (because of  the sedation medicines used during the test).    FOLLOW UP: Our staff will call the number listed on your records the next business day following your procedure.  We will call around 7:15- 8:00 am to check on you and address any questions or concerns that you may have regarding the information given to you following your procedure. If we do not reach you, we will leave a message.     If any biopsies were taken you will be contacted by phone or by letter within the next 1-3 weeks.  Please call us at [phone] if you have not heard about the biopsies in 3 weeks.    SIGNATURES/CONFIDENTIALITY: You and/or your care partner have signed paperwork which will be entered into your electronic medical record.  These signatures attest to the fact that that the information above on your After Visit Summary has been reviewed and is understood.  Full responsibility of the confidentiality of this discharge information lies with you and/or your care-partner.  

## 2022-01-01 NOTE — Progress Notes (Signed)
VSS, transported to PACU °

## 2022-01-01 NOTE — Progress Notes (Signed)
Called to room to assist during endoscopic procedure.  Patient ID and intended procedure confirmed with present staff. Received instructions for my participation in the procedure from the performing physician.  

## 2022-01-01 NOTE — Op Note (Signed)
[facility] Patient Name: Daniel Heath Procedure Date: 01/01/2022 2:14 PM MRN: [id number] Endoscopist: Mallie Mussel L. Loletha Carrow , MD, [id number] Age: 76 Referring MD:  Date of Birth: [birth year] Gender: Male Account #: [id number] Procedure:                Colonoscopy Indications:              Screening for colorectal malignant neoplasm                           no polyps last colonoscopy June 2013 Medicines:                Monitored Anesthesia Care Procedure:                Pre-Anesthesia Assessment:                           - Prior to the procedure, a History and Physical                            was performed, and patient medications and                            allergies were reviewed. The patient's tolerance of                            previous anesthesia was also reviewed. The risks                            and benefits of the procedure and the sedation                            options and risks were discussed with the patient.                            All questions were answered, and informed consent                            was obtained. Prior Anticoagulants: The patient has                            taken no anticoagulant or antiplatelet agents. ASA                            Grade Assessment: II - A patient with mild systemic                            disease. After reviewing the risks and benefits,                            the patient was deemed in satisfactory condition to                            undergo the procedure.  After obtaining informed consent, the colonoscope                            was passed under direct vision. Throughout the                            procedure, the patient's blood pressure, pulse, and                            oxygen saturations were monitored continuously. The                            Olympus PCF-H190DL (#[id number]) Colonoscope was                            introduced through the anus and  advanced to the the                            terminal ileum, with identification of the                            appendiceal orifice and IC valve. The colonoscopy                            was performed without difficulty. The patient                            tolerated the procedure well. The quality of the                            bowel preparation was good. The terminal ileum,                            ileocecal valve, appendiceal orifice, and rectum                            were photographed. Scope In: 2:32:50 PM Scope Out: 2:49:56 PM Scope Withdrawal Time: 0 hours 12 minutes 27 seconds  Total Procedure Duration: 0 hours 17 minutes 6 seconds  Findings:                 The perianal and digital rectal examinations were                            normal.                           The terminal ileum appeared normal.                           Repeat examination of right colon under NBI                            performed.  Two sessile polyps were found in the transverse                            colon. The polyps were diminutive in size. These                            polyps were removed with a cold snare. Resection                            and retrieval were complete.                           Multiple diverticula were found in the left colon                            and right colon.                           Internal hemorrhoids were found.                           The exam was otherwise without abnormality on                            direct and retroflexion views. Complications:            No immediate complications. Estimated Blood Loss:     Estimated blood loss was minimal. Impression:               - The examined portion of the ileum was normal.                           - Two diminutive polyps in the transverse colon,                            removed with a cold snare. Resected and retrieved.                           -  Diverticulosis in the left colon and in the right                            colon.                           - Internal hemorrhoids.                           - The examination was otherwise normal on direct                            and retroflexion views. Recommendation:           - Patient has a contact number available for                            emergencies. The signs and symptoms of potential  delayed complications were discussed with the                            patient. Return to normal activities tomorrow.                            Written discharge instructions were provided to the                            patient.                           - Resume previous diet.                           - Continue present medications.                           - Await pathology results.                           - No repeat surveillance colonoscopy recommended                            (age, current guidelines and low risk polyp                            history). Elloise Roark L. Loletha Carrow, MD 01/01/2022 2:54:36 PM This report has been signed electronically.

## 2022-01-01 NOTE — Progress Notes (Signed)
No changes to clinical history since GI office visit on 12/14/21.  The patient is appropriate for an endoscopic procedure in the ambulatory setting.  - Wilfrid Lund, MD

## 2022-01-01 NOTE — Progress Notes (Signed)
Pt's states no medical or surgical changes since previsit or office visit. 

## 2022-01-02 ENCOUNTER — Telehealth: Payer: Self-pay

## 2022-01-02 NOTE — Telephone Encounter (Signed)
  Follow up Call-     01/01/2022    1:14 PM  Call back number  Post procedure Call Back phone  # [phone]  Permission to leave phone message Yes     Patient questions:  Do you have a fever, pain , or abdominal swelling? No. Pain Score  0 *  Have you tolerated food without any problems? Yes.    Have you been able to return to your normal activities? Yes.    Do you have any questions about your discharge instructions: Diet   No. Medications  No. Follow up visit  No.  Do you have questions or concerns about your Care? No.  Actions: * If pain score is 4 or above: No action needed, pain <4.

## 2022-01-04 ENCOUNTER — Encounter: Payer: Self-pay | Admitting: Gastroenterology

## 2022-04-10 DIAGNOSIS — H43813 Vitreous degeneration, bilateral: Secondary | ICD-10-CM | POA: Diagnosis not present

## 2022-04-10 DIAGNOSIS — H43392 Other vitreous opacities, left eye: Secondary | ICD-10-CM | POA: Diagnosis not present

## 2022-04-10 DIAGNOSIS — H2513 Age-related nuclear cataract, bilateral: Secondary | ICD-10-CM | POA: Diagnosis not present

## 2022-05-02 DIAGNOSIS — R0981 Nasal congestion: Secondary | ICD-10-CM | POA: Diagnosis not present

## 2022-05-17 DIAGNOSIS — J302 Other seasonal allergic rhinitis: Secondary | ICD-10-CM | POA: Diagnosis not present

## 2022-05-17 DIAGNOSIS — R058 Other specified cough: Secondary | ICD-10-CM | POA: Diagnosis not present

## 2022-06-21 DIAGNOSIS — Z125 Encounter for screening for malignant neoplasm of prostate: Secondary | ICD-10-CM | POA: Diagnosis not present

## 2022-06-21 DIAGNOSIS — I10 Essential (primary) hypertension: Secondary | ICD-10-CM | POA: Diagnosis not present

## 2022-06-21 DIAGNOSIS — E785 Hyperlipidemia, unspecified: Secondary | ICD-10-CM | POA: Diagnosis not present

## 2022-06-25 DIAGNOSIS — E785 Hyperlipidemia, unspecified: Secondary | ICD-10-CM | POA: Diagnosis not present

## 2022-06-25 DIAGNOSIS — I10 Essential (primary) hypertension: Secondary | ICD-10-CM | POA: Diagnosis not present

## 2022-06-25 DIAGNOSIS — Z1339 Encounter for screening examination for other mental health and behavioral disorders: Secondary | ICD-10-CM | POA: Diagnosis not present

## 2022-06-25 DIAGNOSIS — R82998 Other abnormal findings in urine: Secondary | ICD-10-CM | POA: Diagnosis not present

## 2022-06-25 DIAGNOSIS — Z0000 Encounter for general adult medical examination without abnormal findings: Secondary | ICD-10-CM | POA: Diagnosis not present

## 2022-06-25 DIAGNOSIS — E663 Overweight: Secondary | ICD-10-CM | POA: Diagnosis not present

## 2022-06-25 DIAGNOSIS — M1990 Unspecified osteoarthritis, unspecified site: Secondary | ICD-10-CM | POA: Diagnosis not present

## 2022-06-25 DIAGNOSIS — Z1331 Encounter for screening for depression: Secondary | ICD-10-CM | POA: Diagnosis not present

## 2022-08-30 DIAGNOSIS — L821 Other seborrheic keratosis: Secondary | ICD-10-CM | POA: Diagnosis not present

## 2022-08-30 DIAGNOSIS — L812 Freckles: Secondary | ICD-10-CM | POA: Diagnosis not present

## 2022-08-30 DIAGNOSIS — L738 Other specified follicular disorders: Secondary | ICD-10-CM | POA: Diagnosis not present

## 2022-08-30 DIAGNOSIS — D1801 Hemangioma of skin and subcutaneous tissue: Secondary | ICD-10-CM | POA: Diagnosis not present

## 2022-08-30 DIAGNOSIS — L570 Actinic keratosis: Secondary | ICD-10-CM | POA: Diagnosis not present

## 2023-02-11 DIAGNOSIS — K625 Hemorrhage of anus and rectum: Secondary | ICD-10-CM | POA: Diagnosis not present

## 2023-03-04 DIAGNOSIS — K921 Melena: Secondary | ICD-10-CM | POA: Diagnosis not present

## 2023-03-27 ENCOUNTER — Encounter (HOSPITAL_COMMUNITY): Payer: Self-pay | Admitting: Emergency Medicine

## 2023-03-27 ENCOUNTER — Emergency Department (HOSPITAL_COMMUNITY)
Admission: EM | Admit: 2023-03-27 | Discharge: 2023-03-27 | Disposition: A | Discharge status: Home / Self Care | Payer: Medicare Other | Attending: Emergency Medicine | Admitting: Emergency Medicine

## 2023-03-27 ENCOUNTER — Other Ambulatory Visit: Payer: Self-pay

## 2023-03-27 DIAGNOSIS — R42 Dizziness and giddiness: Secondary | ICD-10-CM | POA: Insufficient documentation

## 2023-03-27 DIAGNOSIS — H9203 Otalgia, bilateral: Secondary | ICD-10-CM | POA: Insufficient documentation

## 2023-03-27 DIAGNOSIS — Z7982 Long term (current) use of aspirin: Secondary | ICD-10-CM | POA: Insufficient documentation

## 2023-03-27 LAB — CBC WITH DIFFERENTIAL/PLATELET
Abs Immature Granulocytes: 0.02 10*3/uL (ref 0.00–0.07)
Basophils Absolute: 0 10*3/uL (ref 0.0–0.1)
Basophils Relative: 1 %
Eosinophils Absolute: 0.1 10*3/uL (ref 0.0–0.5)
Eosinophils Relative: 2 %
HCT: 50 % (ref 39.0–52.0)
Hemoglobin: 16 g/dL (ref 13.0–17.0)
Immature Granulocytes: 0 %
Lymphocytes Relative: 9 %
Lymphs Abs: 0.6 10*3/uL — ABNORMAL LOW (ref 0.7–4.0)
MCH: 31.1 pg (ref 26.0–34.0)
MCHC: 32 g/dL (ref 30.0–36.0)
MCV: 97.3 fL (ref 80.0–100.0)
Monocytes Absolute: 0.5 10*3/uL (ref 0.1–1.0)
Monocytes Relative: 7 %
Neutro Abs: 5.4 10*3/uL (ref 1.7–7.7)
Neutrophils Relative %: 81 %
Platelets: 209 10*3/uL (ref 150–400)
RBC: 5.14 MIL/uL (ref 4.22–5.81)
RDW: 12.5 % (ref 11.5–15.5)
WBC: 6.6 10*3/uL (ref 4.0–10.5)
nRBC: 0 % (ref 0.0–0.2)

## 2023-03-27 LAB — COMPREHENSIVE METABOLIC PANEL
ALT: 21 U/L (ref 0–44)
AST: 23 U/L (ref 15–41)
Albumin: 4.3 g/dL (ref 3.5–5.0)
Alkaline Phosphatase: 69 U/L (ref 38–126)
Anion gap: 8 (ref 5–15)
BUN: 31 mg/dL — ABNORMAL HIGH (ref 8–23)
CO2: 27 mmol/L (ref 22–32)
Calcium: 9.7 mg/dL (ref 8.9–10.3)
Chloride: 103 mmol/L (ref 98–111)
Creatinine, Ser: 0.8 mg/dL (ref 0.61–1.24)
GFR, Estimated: >60 mL/min (ref >60)
Glucose, Bld: 201 mg/dL — ABNORMAL HIGH (ref 70–99)
Potassium: 3.9 mmol/L (ref 3.5–5.1)
Sodium: 138 mmol/L (ref 135–145)
Total Bilirubin: 0.8 mg/dL (ref 0.0–1.2)
Total Protein: 7.3 g/dL (ref 6.5–8.1)

## 2023-03-27 MED ORDER — MECLIZINE HCL 25 MG PO TABS: 25.0000 mg | ORAL_TABLET | Freq: Three times a day (TID) | ORAL | 0 refills | Status: AC | PRN

## 2023-03-27 NOTE — ED Triage Notes (Signed)
 Over the past 3 weeks the patient has had intermittent dizziness and inner ear pressure. While in triage he is complaint free.    EMS vitals: 152/86 72 HR 18 RR 97% SPO2 on room air 166 CBG

## 2023-03-27 NOTE — Discharge Instructions (Signed)
 Take Meclizine  up to 3 times daily for symptoms of feeling off balance.  Your care was provided by me with Dr. Charlyn, ER Attending. We fel you are stable for discharge home. Take Meclizine  as prescribed to see if this improves the symptoms that have been off and on for the past months.   Return to the ED with any new or concerning symptoms at any time.

## 2023-03-27 NOTE — ED Provider Notes (Signed)
 [place] EMERGENCY DEPARTMENT AT [facility] Provider Note   CSN: [id number] Arrival date & time: 03/27/23  1418     History  Chief Complaint  Patient presents with   Dizziness   Otalgia    Daniel Heath is a 78 y.o. male.  Patient to ED reporting symptoms of off balance when walking. This has been happening on and off for years but he states he can drink extra water and symptoms resolve. No nausea, vomiting, visual changes, headache, lateralizing weakness. No falls. He is having a sense of fullness in R>L ears. He reports that symptoms have become more frequent since sinus infection about 4 weeks ago.  The history is provided by the patient. No language interpreter was used.  Dizziness Otalgia      Home Medications Prior to Admission medications   Medication Sig Start Date End Date Taking? Authorizing Provider  meclizine  (ANTIVERT ) 25 MG tablet Take 1 tablet (25 mg total) by mouth 3 (three) times daily as needed for dizziness. 03/27/23  Yes Guenevere Roorda, Margit, PA-C  amLODipine  (NORVASC ) 5 MG tablet Take 1 tablet (5 mg total) by mouth daily. Must estab w/new PCP for future refills 08/15/15   Calone, Gregory D, FNP  aspirin 81 MG tablet Take 81 mg by mouth daily.    [provider]  atorvastatin  (LIPITOR) 20 MG tablet TAKE 1 TABLET (20 MG TOTAL) BY MOUTH DAILY. 08/22/15   Calone, Gregory D, FNP  B Complex-C (SUPER B COMPLEX PO) Take 1 capsule by mouth daily.    [provider]  celecoxib  (CELEBREX ) 200 MG capsule TAKE 1 CAPSULE (200 MG TOTAL) BY MOUTH 2 (TWO) TIMES DAILY AS NEEDED. 02/03/15   Tish Elsie FALCON, MD  Cholecalciferol (VITAMIN D3) 1000 UNITS CAPS Take 1 capsule by mouth daily.    [provider]  Coenzyme Q10 (COQ10) 200 MG CAPS Take 1 capsule by mouth daily.     [provider]  Cyanocobalamin (B-12) 1000 MCG SUBL Place under the tongue.    [provider]  fexofenadine (ALLEGRA) 30 MG tablet Take 30 mg by mouth  2 (two) times daily.    [provider]  Flaxseed, Linseed, (FLAX SEEDS PO) Take 1 capsule by mouth daily.     [provider]  fluticasone  (FLONASE ) 50 MCG/ACT nasal spray Place 2 sprays into the nose as directed. 1 spray in each nostril twice a day as needed. Use the crossover technique as discussed  12/04/10   Tish Elsie FALCON, MD  hydrocortisone  2.5 % lotion APPLY TOPICALLY 2 (TWO) TIMES DAILY. X 1 MONTH. 04/21/20   Livingston Rigg, MD  ibuprofen (ADVIL,MOTRIN) 200 MG tablet Take 400 mg by mouth every 6 (six) hours as needed (pain).    [provider]  loratadine (CLARITIN) 10 MG tablet Take 10 mg by mouth daily as needed for allergies (allergies).     [provider]  losartan  (COZAAR ) 50 MG tablet Take 100 mg by mouth daily. 11/03/21   [provider]  Probiotic Product (ALIGN) 4 MG CAPS Take 1 capsule by mouth daily.     [provider]  Saw Palmetto, Serenoa repens, (SAW PALMETTO PO) Take 1 capsule by mouth daily.    [provider]  Turmeric Curcumin 500 MG CAPS Take 2 capsules by mouth daily.     [provider]      Allergies    Benazepril hcl    Review of Systems   Review of Systems  HENT:  Positive for ear pain.   Neurological:  Positive for dizziness.    Physical Exam Updated Vital Signs BP (!) 150/79   Pulse 73   Temp 97.6 F (36.4 C)   Resp 17   SpO2 98%  Physical Exam Constitutional:      Appearance: He is well-developed.  HENT:     Head: Normocephalic.  Cardiovascular:     Rate and Rhythm: Normal rate and regular rhythm.     Heart sounds: No murmur heard. Pulmonary:     Effort: Pulmonary effort is normal.     Breath sounds: Normal breath sounds. No wheezing, rhonchi or rales.  Abdominal:     General: Bowel sounds are normal.     Palpations: Abdomen is soft.     Tenderness: There is no abdominal tenderness. There is no guarding or rebound.  Musculoskeletal:        General: Normal  range of motion.     Cervical back: Normal range of motion and neck supple.  Skin:    General: Skin is warm and dry.  Neurological:     General: No focal deficit present.     Mental Status: He is alert and oriented to person, place, and time.     GCS: GCS eye subscore is 4. GCS verbal subscore is 5. GCS motor subscore is 6.     Cranial Nerves: No cranial nerve deficit, dysarthria or facial asymmetry.     Sensory: Sensation is intact.     Motor: No weakness, abnormal muscle tone or pronator drift.     Coordination: Coordination is intact. Romberg sign negative. Finger-Nose-Finger Test normal.     Gait: Gait is intact.     ED Results / Procedures / Treatments   Labs (all labs ordered are listed, but only abnormal results are displayed) Labs Reviewed  CBC WITH DIFFERENTIAL/PLATELET - Abnormal; Notable for the following components:      Result Value   Lymphs Abs 0.6 (*)    All other components within normal limits  COMPREHENSIVE METABOLIC PANEL - Abnormal; Notable for the following components:   Glucose, Bld 201 (*)    BUN 31 (*)    All other components within normal limits    EKG EKG Interpretation Date/Time:  Wednesday March 27 2023 20:36:06 EST Ventricular Rate:  69 PR Interval:  181 QRS Duration:  113 QT Interval:  392 QTC Calculation: 420 R Axis:   -32  Text Interpretation: Sinus rhythm Ventricular premature complex Incomplete right bundle branch block Low voltage, precordial leads No acute changes No significant change since last tracing Confirmed by Charlyn Sora ([id number]) on 03/27/2023 9:02:32 PM  Radiology No results found.  Procedures Procedures    Medications Ordered in ED Medications - No data to display  ED Course/ Medical Decision Making/ A&P Clinical Course as of 03/27/23 2258  Wed Mar 27, 2023  2254 Patient with chronic symptoms of intermittent sense of being off balance when walking. More frequent episodes since having a significant sinus infection  about 4 weeks ago. No neurologic deficits on exam. He is asymptomatic in the ED. Labs reassuring. Discussed with Dr. Charlyn. With normal neuro exam, steady gait, asymptomatic in ED, normal labs, recent sinus infection and now ear fullness, do not feel imaging is indicated. Start Meclizine  and follow up with PCP in one week for recheck.  [SU]    Clinical Course User Index [SU] Odell Balls, PA-C  Medical Decision Making          Final Clinical Impression(s) / ED Diagnoses Final diagnoses:  Otalgia of both ears    Rx / DC Orders ED Discharge Orders          Ordered    meclizine  (ANTIVERT ) 25 MG tablet  3 times daily PRN        03/27/23 2249              Odell Balls, PA-C 03/27/23 2258    Charlyn Sora, MD 03/28/23 1559

## 2023-03-27 NOTE — ED Provider Triage Note (Signed)
 Emergency Medicine Provider Triage Evaluation Note  Daniel Heath , a 78 y.o. male  was evaluated in triage.  Pt complains of dizziness and and ear pressure over the past few weeks no injury or trauma reported.  Review of Systems  Positive:  Negative:   Physical Exam  BP 138/85   Pulse 76   Temp (!) 97.5 F (36.4 C) (Oral)   Resp 18   SpO2 100%  Gen:   Awake, no distress   Resp:  Normal effort  MSK:   Moves extremities without difficulty  Other:  Neuro without gross deficit  Medical Decision Making  Medically screening exam initiated at 3:12 PM.  Appropriate orders placed.  Daniel Heath was informed that the remainder of the evaluation will be completed by another provider, this initial triage assessment does not replace that evaluation, and the importance of remaining in the ED until their evaluation is complete.     Daniel Lynwood DEL, PA-C 03/27/23 1519

## 2023-04-04 DIAGNOSIS — R739 Hyperglycemia, unspecified: Secondary | ICD-10-CM | POA: Diagnosis not present

## 2023-04-04 DIAGNOSIS — R42 Dizziness and giddiness: Secondary | ICD-10-CM | POA: Diagnosis not present

## 2023-04-08 DIAGNOSIS — R42 Dizziness and giddiness: Secondary | ICD-10-CM | POA: Diagnosis not present

## 2023-04-08 DIAGNOSIS — H6123 Impacted cerumen, bilateral: Secondary | ICD-10-CM | POA: Diagnosis not present

## 2023-04-15 DIAGNOSIS — R4701 Aphasia: Secondary | ICD-10-CM | POA: Diagnosis not present

## 2023-06-24 DIAGNOSIS — R739 Hyperglycemia, unspecified: Secondary | ICD-10-CM | POA: Diagnosis not present

## 2023-06-24 DIAGNOSIS — E785 Hyperlipidemia, unspecified: Secondary | ICD-10-CM | POA: Diagnosis not present

## 2023-06-24 DIAGNOSIS — Z125 Encounter for screening for malignant neoplasm of prostate: Secondary | ICD-10-CM | POA: Diagnosis not present

## 2023-07-01 DIAGNOSIS — H8110 Benign paroxysmal vertigo, unspecified ear: Secondary | ICD-10-CM | POA: Diagnosis not present

## 2023-07-01 DIAGNOSIS — I10 Essential (primary) hypertension: Secondary | ICD-10-CM | POA: Diagnosis not present

## 2023-07-01 DIAGNOSIS — Z23 Encounter for immunization: Secondary | ICD-10-CM | POA: Diagnosis not present

## 2023-07-01 DIAGNOSIS — R82998 Other abnormal findings in urine: Secondary | ICD-10-CM | POA: Diagnosis not present

## 2023-07-01 DIAGNOSIS — Z0000 Encounter for general adult medical examination without abnormal findings: Secondary | ICD-10-CM | POA: Diagnosis not present

## 2023-07-01 DIAGNOSIS — Z1339 Encounter for screening examination for other mental health and behavioral disorders: Secondary | ICD-10-CM | POA: Diagnosis not present

## 2023-07-01 DIAGNOSIS — Z1331 Encounter for screening for depression: Secondary | ICD-10-CM | POA: Diagnosis not present

## 2023-07-29 DIAGNOSIS — H43812 Vitreous degeneration, left eye: Secondary | ICD-10-CM | POA: Diagnosis not present

## 2023-07-29 DIAGNOSIS — H2513 Age-related nuclear cataract, bilateral: Secondary | ICD-10-CM | POA: Diagnosis not present

## 2023-09-02 DIAGNOSIS — L218 Other seborrheic dermatitis: Secondary | ICD-10-CM | POA: Diagnosis not present

## 2023-09-02 DIAGNOSIS — D225 Melanocytic nevi of trunk: Secondary | ICD-10-CM | POA: Diagnosis not present

## 2023-09-02 DIAGNOSIS — L550 Sunburn of first degree: Secondary | ICD-10-CM | POA: Diagnosis not present

## 2023-09-02 DIAGNOSIS — L821 Other seborrheic keratosis: Secondary | ICD-10-CM | POA: Diagnosis not present
